# Patient Record
Sex: Female | Born: 1995 | Race: White | Hispanic: No | Marital: Single | Smoking: Never smoker
Health system: Southern US, Community
[De-identification: ages and names within clinical notes are randomized; demographics above are authoritative.]

## PROBLEM LIST (undated history)

## (undated) ENCOUNTER — Inpatient Hospital Stay (HOSPITAL_COMMUNITY): Payer: Self-pay

## (undated) DIAGNOSIS — O4290 Premature rupture of membranes, unspecified as to length of time between rupture and onset of labor, unspecified weeks of gestation: Secondary | ICD-10-CM

## (undated) DIAGNOSIS — K8020 Calculus of gallbladder without cholecystitis without obstruction: Secondary | ICD-10-CM

## (undated) DIAGNOSIS — F419 Anxiety disorder, unspecified: Secondary | ICD-10-CM

## (undated) HISTORY — PX: NO PAST SURGERIES: SHX2092

## (undated) HISTORY — PX: WISDOM TOOTH EXTRACTION: SHX21

## (undated) HISTORY — DX: Anxiety disorder, unspecified: F41.9

---
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
## 2013-02-17 ENCOUNTER — Encounter (HOSPITAL_COMMUNITY): Payer: Self-pay | Admitting: *Deleted

## 2013-02-17 ENCOUNTER — Emergency Department (HOSPITAL_COMMUNITY)
Admission: EM | Admit: 2013-02-17 | Discharge: 2013-02-17 | Disposition: A | Discharge status: Home / Self Care | Payer: Managed Care, Other (non HMO) | Attending: Emergency Medicine | Admitting: Emergency Medicine

## 2013-02-17 DIAGNOSIS — R809 Proteinuria, unspecified: Secondary | ICD-10-CM | POA: Insufficient documentation

## 2013-02-17 DIAGNOSIS — Z3202 Encounter for pregnancy test, result negative: Secondary | ICD-10-CM | POA: Insufficient documentation

## 2013-02-17 DIAGNOSIS — E860 Dehydration: Secondary | ICD-10-CM | POA: Insufficient documentation

## 2013-02-17 DIAGNOSIS — R109 Unspecified abdominal pain: Secondary | ICD-10-CM | POA: Insufficient documentation

## 2013-02-17 DIAGNOSIS — J45909 Unspecified asthma, uncomplicated: Secondary | ICD-10-CM | POA: Insufficient documentation

## 2013-02-17 DIAGNOSIS — R197 Diarrhea, unspecified: Secondary | ICD-10-CM | POA: Insufficient documentation

## 2013-02-17 DIAGNOSIS — Z91040 Latex allergy status: Secondary | ICD-10-CM | POA: Insufficient documentation

## 2013-02-17 DIAGNOSIS — R112 Nausea with vomiting, unspecified: Secondary | ICD-10-CM | POA: Insufficient documentation

## 2013-02-17 LAB — CBC WITH DIFFERENTIAL/PLATELET
Basophils Relative: 0 % (ref 0–1)
Eosinophils Absolute: 0 10*3/uL (ref 0.0–0.7)
Eosinophils Relative: 0 % (ref 0–5)
MCH: 29.3 pg (ref 26.0–34.0)
MCHC: 34.8 g/dL (ref 30.0–36.0)
MCV: 84.2 fL (ref 78.0–100.0)
Monocytes Relative: 5 % (ref 3–12)
Neutrophils Relative %: 71 % (ref 43–77)
Platelets: 272 10*3/uL (ref 150–400)

## 2013-02-17 LAB — URINALYSIS, ROUTINE W REFLEX MICROSCOPIC
Glucose, UA: NEGATIVE mg/dL
Ketones, ur: NEGATIVE mg/dL
Leukocytes, UA: NEGATIVE
Specific Gravity, Urine: >1.03 — ABNORMAL HIGH (ref 1.005–1.030)
pH: 6 (ref 5.0–8.0)

## 2013-02-17 LAB — URINE MICROSCOPIC-ADD ON

## 2013-02-17 LAB — COMPREHENSIVE METABOLIC PANEL
ALT: 9 U/L (ref 0–35)
AST: 15 U/L (ref 0–37)
Alkaline Phosphatase: 45 U/L (ref 39–117)
CO2: 24 mEq/L (ref 19–32)
Chloride: 102 mEq/L (ref 96–112)
GFR calc non Af Amer: >90 mL/min (ref >90)
Potassium: 3.4 mEq/L — ABNORMAL LOW (ref 3.5–5.1)
Sodium: 139 mEq/L (ref 135–145)
Total Bilirubin: 0.4 mg/dL (ref 0.3–1.2)

## 2013-02-17 LAB — LIPASE, BLOOD: Lipase: 37 U/L (ref 11–59)

## 2013-02-17 LAB — PREGNANCY, URINE: Preg Test, Ur: NEGATIVE

## 2013-02-17 MED ORDER — ONDANSETRON 4 MG PO TBDP: 4.0000 mg | ORAL_TABLET | Freq: Three times a day (TID) | ORAL | Status: DC | PRN

## 2013-02-17 MED ORDER — SODIUM CHLORIDE 0.9 % IV BOLUS (SEPSIS)
1000.0000 mL | INTRAVENOUS | Status: AC
  Administered 2013-02-17: 1000 mL via INTRAVENOUS

## 2013-02-17 MED ORDER — ONDANSETRON HCL 4 MG/2ML IJ SOLN
4.0000 mg | Freq: Once | INTRAMUSCULAR | Status: AC
  Administered 2013-02-17: 4 mg via INTRAVENOUS
  Filled 2013-02-17: qty 2

## 2013-02-17 MED ORDER — PROMETHAZINE HCL 25 MG PO TABS: 25.0000 mg | ORAL_TABLET | Freq: Four times a day (QID) | ORAL | Status: DC | PRN

## 2013-02-17 NOTE — ED Provider Notes (Signed)
History    CSN: [id number] Arrival date & time 02/17/13  1844  First MD Initiated Contact with Patient 02/17/13 1850     Chief Complaint  Patient presents with  . Emesis   (Consider location/radiation/quality/duration/timing/severity/associated sxs/prior Treatment) HPI Comments: 17 y/o female with hx of asthma who presents with c/o n/v and loose stools - she states that sx started last night, have been persistent, > 14 episodes of vomiting - no improvement, nothing makes worse, no associated diarrhea but notes loose non bloody stools.  She has some subacute abd pain for couple of months in lower abdomen which is unchanged today.  She denies ETOH use, deneis pregnancy and has had no fevers, cough, sob, blurred vision, sinus pressure or sore throat.  She does have some headache which was present last night but not today.  The history is provided by the patient and a friend.   Past Medical History  Diagnosis Date  . Asthma    History reviewed. No pertinent past surgical history. History reviewed. No pertinent family history. History  Substance Use Topics  . Smoking status: Never Smoker   . Smokeless tobacco: Not on file  . Alcohol Use: No   OB History   Grav Para Term Preterm Abortions TAB SAB Ect Mult Living                 Review of Systems  All other systems reviewed and are negative.    Allergies  Latex  Home Medications   Current Outpatient Rx  Name  Route  Sig  Dispense  Refill  . ibuprofen (MIDOL CRAMP FORMULA MAX ST) 200 MG tablet   Oral   Take 200 mg by mouth as needed for pain.          . norgestimate-ethinyl estradiol (SPRINTEC 28) 0.25-35 MG-MCG tablet   Oral   Take 1 tablet by mouth daily.         . ondansetron (ZOFRAN ODT) 4 MG disintegrating tablet   Oral   Take 1 tablet (4 mg total) by mouth every 8 (eight) hours as needed for nausea.   10 tablet   0   . promethazine (PHENERGAN) 25 MG tablet   Oral   Take 1 tablet (25 mg total) by mouth  every 6 (six) hours as needed for nausea.   12 tablet   0    BP 135/77  Pulse 106  Temp(Src) 99.3 F (37.4 C) (Oral)  Resp 16  Ht 5\' 2"  (1.575 m)  Wt 120 lb (54.432 kg)  BMI 21.94 kg/m2  SpO2 100%  LMP 02/13/2013 Physical Exam  Nursing note and vitals reviewed. Constitutional: She appears well-developed and well-nourished. No distress.  HENT:  Head: Normocephalic and atraumatic.  Mouth/Throat: Oropharynx is clear and moist. No oropharyngeal exudate.  Eyes: Conjunctivae and EOM are normal. Pupils are equal, round, and reactive to light. Right eye exhibits no discharge. Left eye exhibits no discharge. No scleral icterus.  Neck: Normal range of motion. Neck supple. No JVD present. No thyromegaly present.  Cardiovascular: Regular rhythm, normal heart sounds and intact distal pulses.  Exam reveals no gallop and no friction rub.   No murmur heard. Mild tachycardia  Pulmonary/Chest: Effort normal and breath sounds normal. No respiratory distress. She has no wheezes. She has no rales.  Abdominal: Soft. Bowel sounds are normal. She exhibits no distension and no mass. There is tenderness ( minimal diffuse ttp, SP > other locations, no guarding, no masses, no peritoneal signs, no  muprhy sign).  Musculoskeletal: Normal range of motion. She exhibits no edema and no tenderness.  Lymphadenopathy:    She has no cervical adenopathy.  Neurological: She is alert. Coordination normal.  Skin: Skin is warm and dry. No rash noted. No erythema.  Psychiatric: She has a normal mood and affect. Her behavior is normal.    ED Course  Procedures (including critical care time) Labs Reviewed  URINALYSIS, ROUTINE W REFLEX MICROSCOPIC - Abnormal; Notable for the following:    Specific Gravity, Urine >1.030 (*)    Protein, ur >300 (*)    All other components within normal limits  COMPREHENSIVE METABOLIC PANEL - Abnormal; Notable for the following:    Potassium 3.4 (*)    Glucose, Bld 119 (*)    All other  components within normal limits  URINE MICROSCOPIC-ADD ON - Abnormal; Notable for the following:    Squamous Epithelial / LPF FEW (*)    Bacteria, UA FEW (*)    All other components within normal limits  URINE CULTURE  PREGNANCY, URINE  CBC WITH DIFFERENTIAL  LIPASE, BLOOD   No results found. 1. Nausea and vomiting   2. Proteinuria   3. Dehydration     MDM  The pt appears non toxic but after not eating anything but a couple of crackers today with minimal fluid intake, I suspect that the pt is dehdyrated explaining the tachycardia.  Afebrile, no hypotension and no pulmonary findings.  R/o pregnancy, UTI, lyts, possible infectious but no diarrhea.  Labs, fulids, antiemetics.  Labs show proteinuria - no other acute findings - she has improved with IVF and zofran - stable for d/c.  Meds given in ED:  Medications  sodium chloride 0.9 % bolus 1,000 mL (1,000 mLs Intravenous New Bag/Given 02/17/13 1934)  ondansetron (ZOFRAN) injection 4 mg (4 mg Intravenous Given 02/17/13 1934)    New Prescriptions   ONDANSETRON (ZOFRAN ODT) 4 MG DISINTEGRATING TABLET    Take 1 tablet (4 mg total) by mouth every 8 (eight) hours as needed for nausea.   PROMETHAZINE (PHENERGAN) 25 MG TABLET    Take 1 tablet (25 mg total) by mouth every 6 (six) hours as needed for nausea.      Vida Roller, MD 02/17/13 [phone]

## 2013-02-17 NOTE — ED Notes (Signed)
NVD since last night, abd pain,

## 2013-02-19 LAB — URINE CULTURE

## 2013-08-11 ENCOUNTER — Emergency Department (HOSPITAL_COMMUNITY): Payer: Managed Care, Other (non HMO)

## 2013-08-11 ENCOUNTER — Emergency Department (HOSPITAL_COMMUNITY)
Admission: EM | Admit: 2013-08-11 | Discharge: 2013-08-11 | Disposition: A | Discharge status: Home / Self Care | Payer: Managed Care, Other (non HMO) | Attending: Emergency Medicine | Admitting: Emergency Medicine

## 2013-08-11 ENCOUNTER — Encounter (HOSPITAL_COMMUNITY): Payer: Self-pay | Admitting: Emergency Medicine

## 2013-08-11 DIAGNOSIS — Z79899 Other long term (current) drug therapy: Secondary | ICD-10-CM | POA: Insufficient documentation

## 2013-08-11 DIAGNOSIS — K8020 Calculus of gallbladder without cholecystitis without obstruction: Secondary | ICD-10-CM | POA: Insufficient documentation

## 2013-08-11 DIAGNOSIS — Z91040 Latex allergy status: Secondary | ICD-10-CM | POA: Insufficient documentation

## 2013-08-11 DIAGNOSIS — J45909 Unspecified asthma, uncomplicated: Secondary | ICD-10-CM | POA: Insufficient documentation

## 2013-08-11 LAB — COMPREHENSIVE METABOLIC PANEL
ALT: 10 U/L (ref 0–35)
AST: 16 U/L (ref 0–37)
Albumin: 4.6 g/dL (ref 3.5–5.2)
Alkaline Phosphatase: 60 U/L (ref 39–117)
BUN: 9 mg/dL (ref 6–23)
Chloride: 107 mEq/L (ref 96–112)
Potassium: 3.6 mEq/L (ref 3.5–5.1)
Sodium: 145 mEq/L (ref 135–145)
Total Bilirubin: 0.3 mg/dL (ref 0.3–1.2)
Total Protein: 8.4 g/dL — ABNORMAL HIGH (ref 6.0–8.3)

## 2013-08-11 LAB — LIPASE, BLOOD: Lipase: 38 U/L (ref 11–59)

## 2013-08-11 LAB — CBC WITH DIFFERENTIAL/PLATELET
Basophils Absolute: 0 10*3/uL (ref 0.0–0.1)
Basophils Relative: 0 % (ref 0–1)
Eosinophils Absolute: 0.1 10*3/uL (ref 0.0–0.7)
Hemoglobin: 14 g/dL (ref 12.0–15.0)
MCH: 29.2 pg (ref 26.0–34.0)
MCHC: 34.9 g/dL (ref 30.0–36.0)
Monocytes Relative: 5 % (ref 3–12)
Neutro Abs: 5.8 10*3/uL (ref 1.7–7.7)
Neutrophils Relative %: 63 % (ref 43–77)
Platelets: 315 10*3/uL (ref 150–400)

## 2013-08-11 MED ORDER — OXYCODONE-ACETAMINOPHEN 5-325 MG PO TABS: 1.0000 | ORAL_TABLET | ORAL | Status: DC | PRN

## 2013-08-11 MED ORDER — PROMETHAZINE HCL 25 MG PO TABS: 25.0000 mg | ORAL_TABLET | Freq: Four times a day (QID) | ORAL | Status: DC | PRN

## 2013-08-11 NOTE — ED Provider Notes (Signed)
CSN: [id number]     Arrival date & time 08/11/13  1057 History  This chart was scribed for Donnetta Hutching, MD by Bennett Scrape, ED Scribe. This patient was seen in room APA08/[facility]08 and the patient's care was started at 2:11 PM.  Chief Complaint  Patient presents with  . Abdominal Pain   The history is provided by the patient. No language interpreter was used.    HPI Comments: Renee Matthews is a 17 y.o. female who presents to the Emergency Department complaining of intermittent bilateral upper abdomen that radiates to bilateral flanks that has been occuring since the birth of her son in 2011. She states that the episodes usually last 2 hours but today, this episode last 5 hours. She describes the pain as "achy". She denies having any triggers and she denies having any prior work ups. She denies any nausea, emesis or diarrhea.    Past Medical History  Diagnosis Date  . Asthma    History reviewed. No pertinent past surgical history. History reviewed. No pertinent family history. History  Substance Use Topics  . Smoking status: Never Smoker   . Smokeless tobacco: Not on file  . Alcohol Use: No    Review of Systems  A complete 10 system review of systems was obtained and all systems are negative except as noted in the HPI and PMH.   Allergies  Latex  Home Medications   Current Outpatient Rx  Name  Route  Sig  Dispense  Refill  . ibuprofen (MIDOL CRAMP FORMULA MAX ST) 200 MG tablet   Oral   Take 200 mg by mouth as needed for pain.          . norgestimate-ethinyl estradiol (SPRINTEC 28) 0.25-35 MG-MCG tablet   Oral   Take 1 tablet by mouth daily.         . ondansetron (ZOFRAN ODT) 4 MG disintegrating tablet   Oral   Take 1 tablet (4 mg total) by mouth every 8 (eight) hours as needed for nausea.   10 tablet   0   . promethazine (PHENERGAN) 25 MG tablet   Oral   Take 1 tablet (25 mg total) by mouth every 6 (six) hours as needed for nausea.   12 tablet   0     Triage Vitals: BP 119/68  Pulse 100  Temp(Src) 98.2 F (36.8 C) (Oral)  Resp 18  Ht 5\' 2"  (1.575 m)  Wt 120 lb (54.432 kg)  BMI 21.94 kg/m2  SpO2 100%  LMP 08/07/2013  Physical Exam  Nursing note and vitals reviewed. Constitutional: She is oriented to person, place, and time. She appears well-developed and well-nourished.  HENT:  Head: Normocephalic and atraumatic.  Eyes: Conjunctivae and EOM are normal. Pupils are equal, round, and reactive to light.  Neck: Normal range of motion. Neck supple.  Cardiovascular: Normal rate, regular rhythm and normal heart sounds.   Pulmonary/Chest: Effort normal and breath sounds normal.  Abdominal: Soft. Bowel sounds are normal.  Musculoskeletal: Normal range of motion.  Neurological: She is alert and oriented to person, place, and time.  Skin: Skin is warm and dry.  Psychiatric: She has a normal mood and affect. Her behavior is normal.    ED Course  Procedures (including critical care time)  DIAGNOSTIC STUDIES: Oxygen Saturation is 100% on room air, normal by my interpretation.    COORDINATION OF CARE: 2:30 PM-Discussed treatment plan which includes [place] to check out the gallbladder, CBC panel, CMP, and lipase with pt  at bedside and pt agreed to plan.   Labs Review Labs Reviewed  COMPREHENSIVE METABOLIC PANEL - Abnormal; Notable for the following:    Glucose, Bld 107 (*)    Total Protein 8.4 (*)    All other components within normal limits  CBC WITH DIFFERENTIAL  LIPASE, BLOOD  AMYLASE   Imaging Review US Abdomen Limited Ruq  08/11/2013   CLINICAL DATA:  Right upper quadrant pain.  EXAM: US ABDOMEN LIMITED - RIGHT UPPER QUADRANT  COMPARISON:  None.  FINDINGS: Gallbladder  Gallbladder is moderately distended. There multiple shadowing stones. There is no wall thickening or evidence of acute cholecystitis.  Common bile duct  Diameter: 4.6 mm.  No duct stone is seen.  Liver:  No focal lesion identified. Within normal limits in  parenchymal echogenicity.  IMPRESSION: 1. Cholelithiasis. No sonographic evidence of acute cholecystitis. No other abnormalities.   Electronically Signed   By: Amie Portland M.D.   On: 08/11/2013 15:16    EKG Interpretation   None       MDM  No diagnosis found. No acute abdomen. Ultrasound reveals cholelithiasis but no cholecystitis. Referral to general surgery. Discharge medications Percocet and Phenergan 25 mg  I personally performed the services described in this documentation, which was scribed in my presence. The recorded information has been reviewed and is accurate.    Donnetta Hutching, MD 08/11/13 [phone]

## 2013-08-11 NOTE — ED Notes (Signed)
Upper abd pain, nausea, no vomiting.  Tender upper abd  And across lower ant ribs.   Says she was seen here for same in past.

## 2013-08-11 NOTE — ED Notes (Signed)
Patient transported to Ultrasound 

## 2014-08-30 ENCOUNTER — Encounter (HOSPITAL_COMMUNITY): Payer: Self-pay | Admitting: *Deleted

## 2014-08-30 ENCOUNTER — Emergency Department (HOSPITAL_COMMUNITY)
Admission: EM | Admit: 2014-08-30 | Discharge: 2014-08-30 | Disposition: A | Discharge status: Home / Self Care | Payer: Medicaid Other | Attending: Emergency Medicine | Admitting: Emergency Medicine

## 2014-08-30 DIAGNOSIS — J45909 Unspecified asthma, uncomplicated: Secondary | ICD-10-CM | POA: Diagnosis not present

## 2014-08-30 DIAGNOSIS — Z91040 Latex allergy status: Secondary | ICD-10-CM | POA: Diagnosis not present

## 2014-08-30 DIAGNOSIS — K8050 Calculus of bile duct without cholangitis or cholecystitis without obstruction: Secondary | ICD-10-CM | POA: Diagnosis not present

## 2014-08-30 DIAGNOSIS — R1011 Right upper quadrant pain: Secondary | ICD-10-CM | POA: Diagnosis present

## 2014-08-30 LAB — CBC WITH DIFFERENTIAL/PLATELET
Basophils Absolute: 0.1 10*3/uL (ref 0.0–0.1)
Basophils Relative: 1 % (ref 0–1)
EOS ABS: 0.1 10*3/uL (ref 0.0–0.7)
Eosinophils Relative: 1 % (ref 0–5)
HEMATOCRIT: 40.9 % (ref 36.0–46.0)
Hemoglobin: 13.7 g/dL (ref 12.0–15.0)
LYMPHS ABS: 3.4 10*3/uL (ref 0.7–4.0)
LYMPHS PCT: 34 % (ref 12–46)
MCH: 28.6 pg (ref 26.0–34.0)
MCHC: 33.5 g/dL (ref 30.0–36.0)
MCV: 85.4 fL (ref 78.0–100.0)
MONOS PCT: 5 % (ref 3–12)
Monocytes Absolute: 0.5 10*3/uL (ref 0.1–1.0)
Neutro Abs: 6.1 10*3/uL (ref 1.7–7.7)
Neutrophils Relative %: 59 % (ref 43–77)
Platelets: 349 10*3/uL (ref 150–400)
RBC: 4.79 MIL/uL (ref 3.87–5.11)
RDW: 13.2 % (ref 11.5–15.5)
WBC: 10.2 10*3/uL (ref 4.0–10.5)

## 2014-08-30 LAB — COMPREHENSIVE METABOLIC PANEL
ALBUMIN: 4.6 g/dL (ref 3.5–5.2)
ALK PHOS: 47 U/L (ref 39–117)
ALT: 19 U/L (ref 0–35)
AST: 22 U/L (ref 0–37)
Anion gap: 6 (ref 5–15)
BILIRUBIN TOTAL: 0.3 mg/dL (ref 0.3–1.2)
BUN: 8 mg/dL (ref 6–23)
CHLORIDE: 110 meq/L (ref 96–112)
CO2: 26 mmol/L (ref 19–32)
Calcium: 9.8 mg/dL (ref 8.4–10.5)
Creatinine, Ser: 0.82 mg/dL (ref 0.50–1.10)
GFR calc Af Amer: >90 mL/min (ref >90)
GFR calc non Af Amer: >90 mL/min (ref >90)
Glucose, Bld: 93 mg/dL (ref 70–99)
POTASSIUM: 4.3 mmol/L (ref 3.5–5.1)
SODIUM: 142 mmol/L (ref 135–145)
TOTAL PROTEIN: 7.7 g/dL (ref 6.0–8.3)

## 2014-08-30 LAB — LIPASE, BLOOD: Lipase: 34 U/L (ref 11–59)

## 2014-08-30 MED ORDER — HYDROCODONE-ACETAMINOPHEN 5-325 MG PO TABS
1.0000 | ORAL_TABLET | Freq: Once | ORAL | Status: AC
  Administered 2014-08-30: 1 via ORAL
  Filled 2014-08-30: qty 1

## 2014-08-30 NOTE — Discharge Instructions (Signed)
Biliary Colic  °Biliary colic is a steady or irregular pain in the upper abdomen. It is usually under the right side of the rib cage. It happens when gallstones interfere with the normal flow of bile from the gallbladder. Bile is a liquid that helps to digest fats. Bile is made in the liver and stored in the gallbladder. When you eat a meal, bile passes from the gallbladder through the cystic duct and the common bile duct into the small intestine. There, it mixes with partially digested food. If a gallstone blocks either of these ducts, the normal flow of bile is blocked. The muscle cells in the bile duct contract forcefully to try to move the stone. This causes the pain of biliary colic.  °SYMPTOMS  °· A person with biliary colic usually complains of pain in the upper abdomen. This pain can be: °¨ In the center of the upper abdomen just below the breastbone. °¨ In the upper-right part of the abdomen, near the gallbladder and liver. °¨ Spread back toward the right shoulder blade. °· Nausea and vomiting. °· The pain usually occurs after eating. °· Biliary colic is usually triggered by the digestive system's demand for bile. The demand for bile is high after fatty meals. Symptoms can also occur when a person who has been fasting suddenly eats a very large meal. Most episodes of biliary colic pass after 1 to 5 hours. After the most intense pain passes, your abdomen may continue to ache mildly for about 24 hours. °DIAGNOSIS  °After you describe your symptoms, your caregiver will perform a physical exam. He or she will pay attention to the upper right portion of your belly (abdomen). This is the area of your liver and gallbladder. An ultrasound will help your caregiver look for gallstones. Specialized scans of the gallbladder may also be done. Blood tests may be done, especially if you have fever or if your pain persists. °PREVENTION  °Biliary colic can be prevented by controlling the risk factors for gallstones. Some of  these risk factors, such as heredity, increasing age, and pregnancy are a normal part of life. Obesity and a high-fat diet are risk factors you can change through a healthy lifestyle. Women going through menopause who take hormone replacement therapy (estrogen) are also more likely to develop biliary colic. °TREATMENT  °· Pain medication may be prescribed. °· You may be encouraged to eat a fat-free diet. °· If the first episode of biliary colic is severe, or episodes of colic keep retuning, surgery to remove the gallbladder (cholecystectomy) is usually recommended. This procedure can be done through small incisions using an instrument called a laparoscope. The procedure often requires a brief stay in the hospital. Some people can leave the hospital the same day. It is the most widely used treatment in people troubled by painful gallstones. It is effective and safe, with no complications in more than 90% of cases. °· If surgery cannot be done, medication that dissolves gallstones may be used. This medication is expensive and can take months or years to work. Only small stones will dissolve. °· Rarely, medication to dissolve gallstones is combined with a procedure called shock-wave lithotripsy. This procedure uses carefully aimed shock waves to break up gallstones. In many people treated with this procedure, gallstones form again within a few years. °PROGNOSIS  °If gallstones block your cystic duct or common bile duct, you are at risk for repeated episodes of biliary colic. There is also a 25% chance that you will develop   a gallbladder infection(acute cholecystitis), or some other complication of gallstones within 10 to 20 years. If you have surgery, schedule it at a time that is convenient for you and at a time when you are not sick. HOME CARE INSTRUCTIONS   Drink plenty of clear fluids.  Avoid fatty, greasy or fried foods, or any foods that make your pain worse.  Take medications as directed. SEEK MEDICAL  CARE IF:   You develop a fever over 100.5 F (38.1 C).  Your pain gets worse over time.  You develop nausea that prevents you from eating and drinking.  You develop vomiting. SEEK IMMEDIATE MEDICAL CARE IF:   You have continuous or severe belly (abdominal) pain which is not relieved with medications.  You develop nausea and vomiting which is not relieved with medications.  You have symptoms of biliary colic and you suddenly develop a fever and shaking chills. This may signal cholecystitis. Call your caregiver immediately.  You develop a yellow color to your skin or the white part of your eyes (jaundice). Document Released: 01/11/2006 Document Revised: 11/02/2011 Document Reviewed: 03/22/2008 [facility] Patient Information 2015 [employer] [place]. This information is not intended to replace advice given to you by your health care provider. Make sure you discuss any questions you have with your health care provider.  Make appointment to follow-up with general surgery. Gallbladder needs to be removed. Return for any new or worse symptoms. Return if the abdominal pain comes back and does not resolve over several hours. Or for fever or for persistent vomiting.

## 2014-08-30 NOTE — ED Notes (Signed)
Pt with abd pain, hx of same and was dx with gallstones, + diarrhea, denies N/V

## 2014-08-30 NOTE — ED Provider Notes (Addendum)
CSN: [id number]     Arrival date & time 08/30/14  1904 History   First MD Initiated Contact with Patient 08/30/14 1936     Chief Complaint  Patient presents with  . Abdominal Pain     (Consider location/radiation/quality/duration/timing/severity/associated sxs/prior Treatment) Patient is a 19 y.o. female presenting with abdominal pain. The history is provided by the patient.  Abdominal Pain Associated symptoms: no chest pain, no diarrhea, no dysuria, no fever, no nausea, no shortness of breath and no vomiting    patient seen in December with right upper quadrant abdominal pain ultrasound showed gallstones normal common bile duct. Therefore symptoms consistent with symptomatic gallstones. Patient [place] But did not follow-up. Patient had recurrent abdominal pain early this morning which resolved. And then it's 5 PM this evening it reoccurred. Still present. But improving. Currently 5 out of 10. No nausea no vomiting no diarrhea. Patient appears comfortable in no significant distress.  Past Medical History  Diagnosis Date  . Asthma    History reviewed. No pertinent past surgical history. History reviewed. No pertinent family history. History  Substance Use Topics  . Smoking status: Never Smoker   . Smokeless tobacco: Not on file  . Alcohol Use: No   OB History    No data available     Review of Systems  Constitutional: Negative for fever.  HENT: Negative for congestion.   Eyes: Negative for redness.  Respiratory: Negative for shortness of breath.   Cardiovascular: Negative for chest pain.  Gastrointestinal: Positive for abdominal pain. Negative for nausea, vomiting and diarrhea.  Genitourinary: Negative for dysuria.  Musculoskeletal: Negative for back pain.  Skin: Negative for rash.  Neurological: Negative for headaches.  Hematological: Does not bruise/bleed easily.  Psychiatric/Behavioral: Negative for confusion.       Allergies  Latex  Home Medications   Prior to Admission medications   Medication Sig Start Date End Date Taking? Authorizing Provider  ibuprofen (MIDOL CRAMP FORMULA MAX ST) 200 MG tablet Take 200 mg by mouth daily as needed for cramping (ca).     Historical Provider, MD  oxyCODONE-acetaminophen (PERCOCET) 5-325 MG per tablet Take 1 tablet by mouth every 4 (four) hours as needed. Patient not taking: Reported on 08/30/2014 08/11/13   Donnetta HutchingBrian Cook, MD  promethazine (PHENERGAN) 25 MG tablet Take 1 tablet (25 mg total) by mouth every 6 (six) hours as needed for nausea or vomiting. Patient not taking: Reported on 08/30/2014 08/11/13   Donnetta HutchingBrian Cook, MD   BP 122/84 mmHg  Pulse 94  Temp(Src) 98.1 F (36.7 C) (Oral)  Resp 20  Ht 5\' 3"  (1.6 m)  SpO2 100%  LMP 08/13/2014 Physical Exam  Constitutional: She is oriented to person, place, and time. She appears well-developed and well-nourished. No distress.  HENT:  Head: Normocephalic and atraumatic.  Mouth/Throat: Oropharynx is clear and moist.  Eyes: Conjunctivae and EOM are normal. Pupils are equal, round, and reactive to light.  Neck: Normal range of motion.  Cardiovascular: Normal rate, regular rhythm and normal heart sounds.   Pulmonary/Chest: Effort normal and breath sounds normal. No respiratory distress.  Abdominal: Soft. Bowel sounds are normal. There is no tenderness.  Musculoskeletal: Normal range of motion. She exhibits no edema.  Neurological: She is alert and oriented to person, place, and time. No cranial nerve deficit. She exhibits normal muscle tone. Coordination normal.  Skin: Skin is warm. No rash noted.  Nursing note and vitals reviewed.   ED Course  Procedures (  including critical care time) Labs Review Labs Reviewed  CBC WITH DIFFERENTIAL  COMPREHENSIVE METABOLIC PANEL  LIPASE, BLOOD   Results for orders placed or performed during the hospital encounter of 08/30/14  CBC with Differential  Result Value Ref Range    WBC 10.2 4.0 - 10.5 K/uL   RBC 4.79 3.87 - 5.11 MIL/uL   Hemoglobin 13.7 12.0 - 15.0 g/dL   HCT 16.1 09.6 - 04.5 %   MCV 85.4 78.0 - 100.0 fL   MCH 28.6 26.0 - 34.0 pg   MCHC 33.5 30.0 - 36.0 g/dL   RDW 40.9 81.1 - 91.4 %   Platelets 349 150 - 400 K/uL   Neutrophils Relative % 59 43 - 77 %   Neutro Abs 6.1 1.7 - 7.7 K/uL   Lymphocytes Relative 34 12 - 46 %   Lymphs Abs 3.4 0.7 - 4.0 K/uL   Monocytes Relative 5 3 - 12 %   Monocytes Absolute 0.5 0.1 - 1.0 K/uL   Eosinophils Relative 1 0 - 5 %   Eosinophils Absolute 0.1 0.0 - 0.7 K/uL   Basophils Relative 1 0 - 1 %   Basophils Absolute 0.1 0.0 - 0.1 K/uL   Results for orders placed or performed during the hospital encounter of 08/30/14  Comprehensive metabolic panel  Result Value Ref Range   Sodium 142 135 - 145 mmol/L   Potassium 4.3 3.5 - 5.1 mmol/L   Chloride 110 96 - 112 mEq/L   CO2 26 19 - 32 mmol/L   Glucose, Bld 93 70 - 99 mg/dL   BUN 8 6 - 23 mg/dL   Creatinine, Ser 7.82 0.50 - 1.10 mg/dL   Calcium 9.8 8.4 - 95.6 mg/dL   Total Protein 7.7 6.0 - 8.3 g/dL   Albumin 4.6 3.5 - 5.2 g/dL   AST 22 0 - 37 U/L   ALT 19 0 - 35 U/L   Alkaline Phosphatase 47 39 - 117 U/L   Total Bilirubin 0.3 0.3 - 1.2 mg/dL   GFR calc non Af Amer >90 >90 mL/min   GFR calc Af Amer >90 >90 mL/min   Anion gap 6 5 - 15  Lipase, blood  Result Value Ref Range   Lipase 34 11 - 59 U/L  CBC with Differential  Result Value Ref Range   WBC 10.2 4.0 - 10.5 K/uL   RBC 4.79 3.87 - 5.11 MIL/uL   Hemoglobin 13.7 12.0 - 15.0 g/dL   HCT 21.3 08.6 - 57.8 %   MCV 85.4 78.0 - 100.0 fL   MCH 28.6 26.0 - 34.0 pg   MCHC 33.5 30.0 - 36.0 g/dL   RDW 46.9 62.9 - 52.8 %   Platelets 349 150 - 400 K/uL   Neutrophils Relative % 59 43 - 77 %   Neutro Abs 6.1 1.7 - 7.7 K/uL   Lymphocytes Relative 34 12 - 46 %   Lymphs Abs 3.4 0.7 - 4.0 K/uL   Monocytes Relative 5 3 - 12 %   Monocytes Absolute 0.5 0.1 - 1.0 K/uL   Eosinophils Relative 1 0 - 5 %   Eosinophils  Absolute 0.1 0.0 - 0.7 K/uL   Basophils Relative 1 0 - 1 %   Basophils Absolute 0.1 0.0 - 0.1 K/uL     Imaging Review No results found.   EKG Interpretation None      MDM   Final diagnoses:  Biliary colic    Patient with diagnosis of gallstones are symptomatic in  December. Was referred to surgery but did not follow-up. Patient with recurrent abdominal pain this morning that resolved and then came back again at 5. Still present. Currently 5 out of 10 and it's improved. No nausea no vomiting no fevers.  Patient's abdomen is without significant tenderness in the right upper quadrant. Patient is nontoxic no acute distress. No significant leukocytosis. Liver function tests will be evaluated to see if is any change compared to December. Patient will be referred again to Dr. Lovell Sheehan. Patient understands the importance of following up.  Liver function tests without any sniffing abnormalities. Patient stable for discharge home. Vanetta Mulders, MD 08/30/14 2119  Vanetta Mulders, MD 08/30/14 2129

## 2014-08-30 NOTE — ED Notes (Signed)
Discharge instructions given, pt demonstrated teach back and verbal understanding. No concerns voiced.  

## 2014-12-07 IMAGING — US US ABDOMEN LIMITED
1 series · 14 of 25 positions shown · non-contrast
Comparison: None.

CLINICAL DATA: Right upper quadrant pain.

EXAM:
US ABDOMEN LIMITED - RIGHT UPPER QUADRANT

[Series 1: us abdomen limited · 0.18mm/px · 14 of 68 slices shown]
[im 1/68]
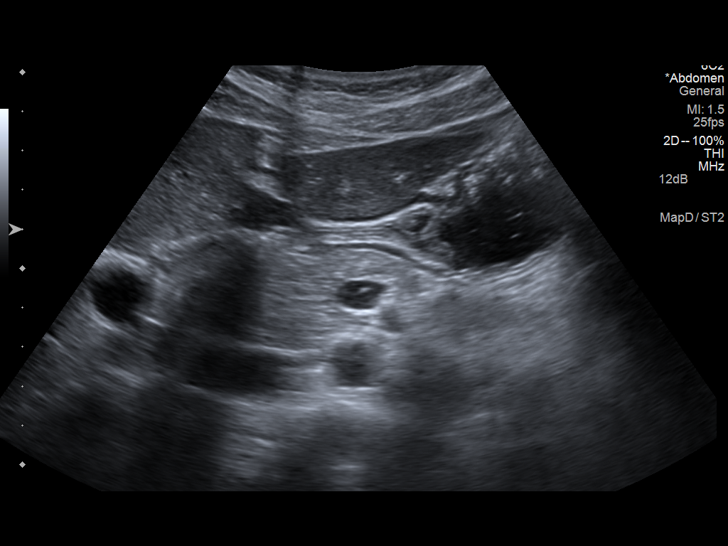
[im 6/68]
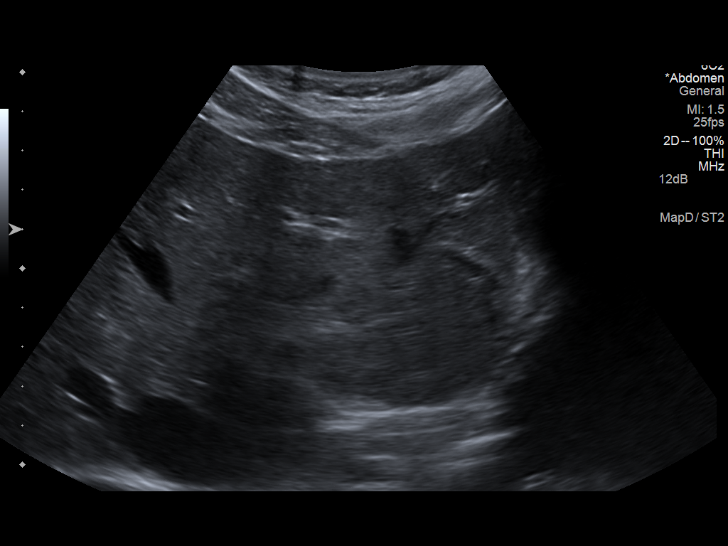
[im 12/68]
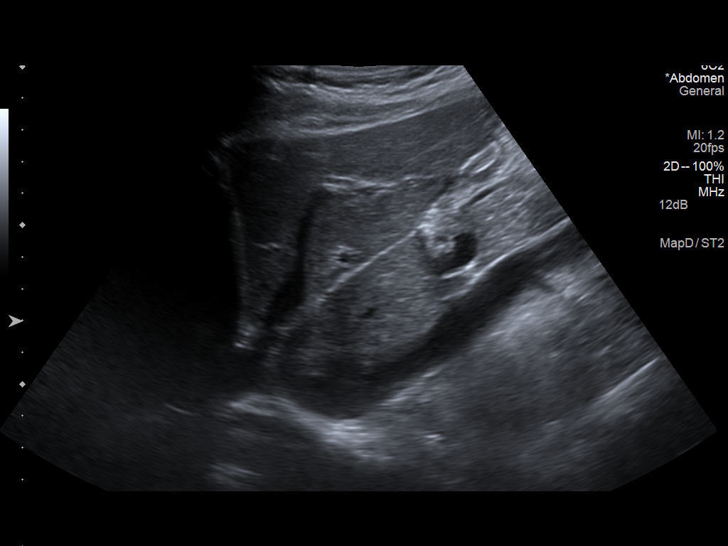
[im 17/68]
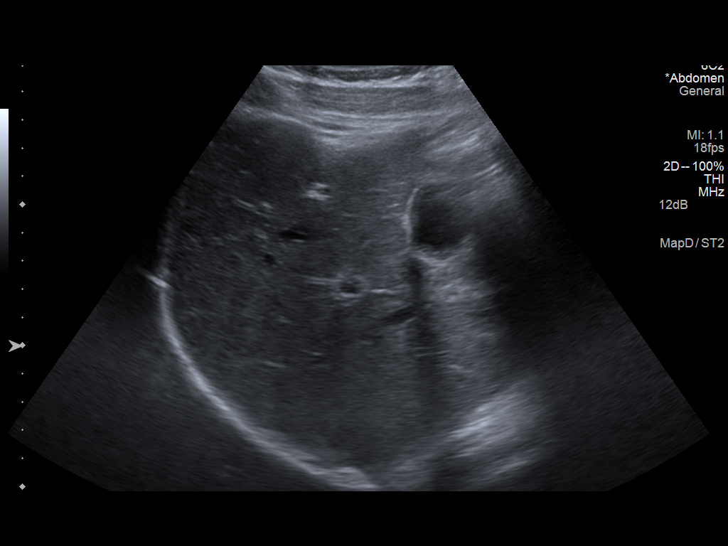
[im 23/68]
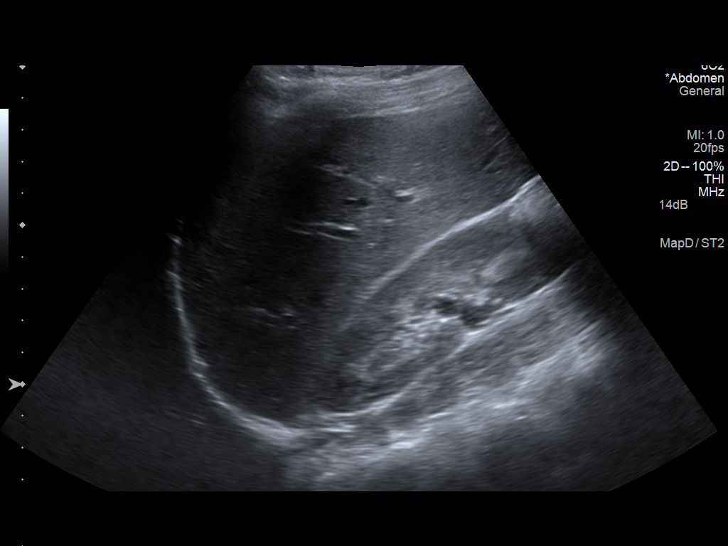
[im 26/68]
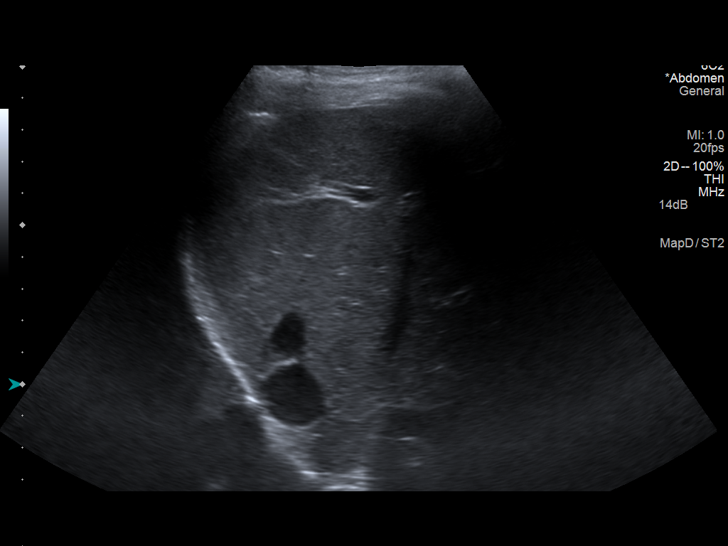
[im 31/68]
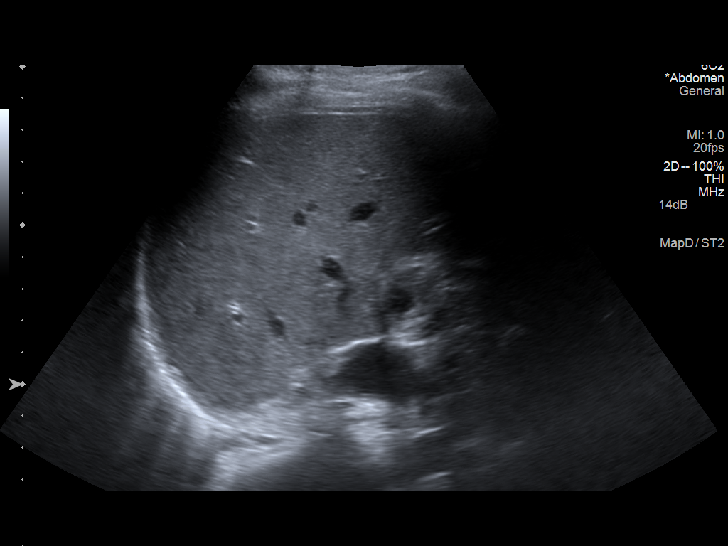
[im 37/68]
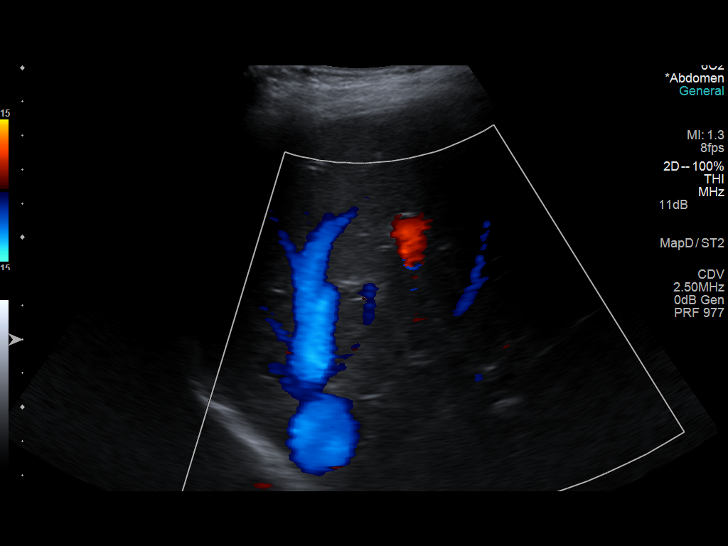
[im 42/68]
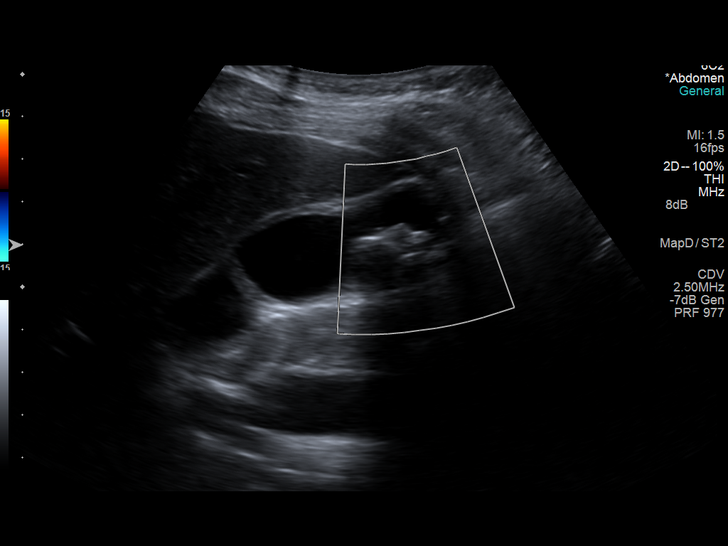
[im 45/68]
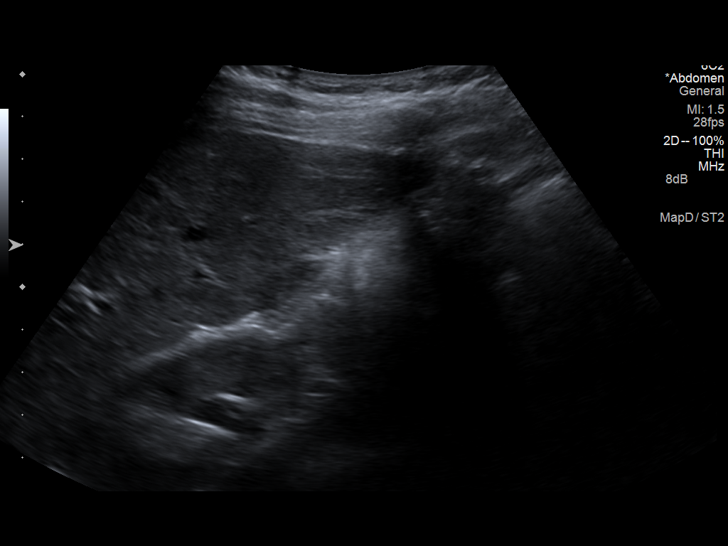
[im 51/68]
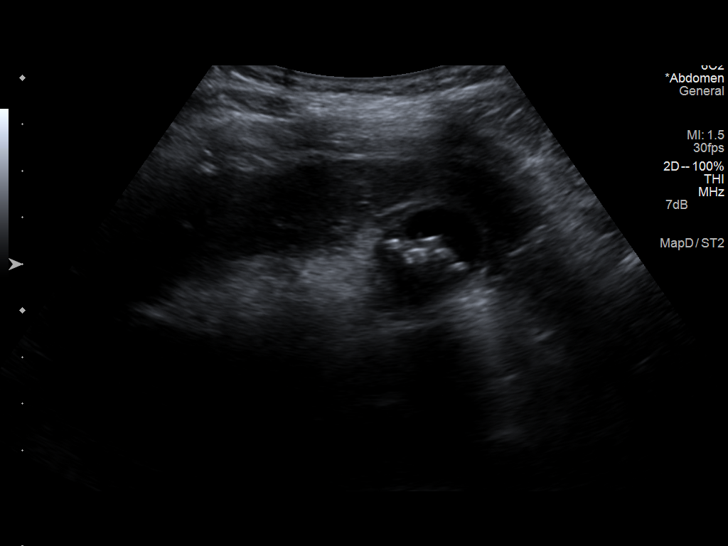
[im 56/68]
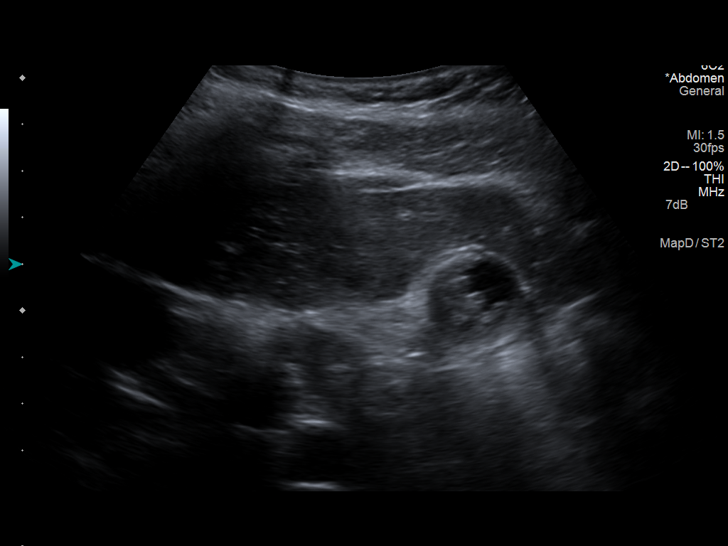
[im 62/68]
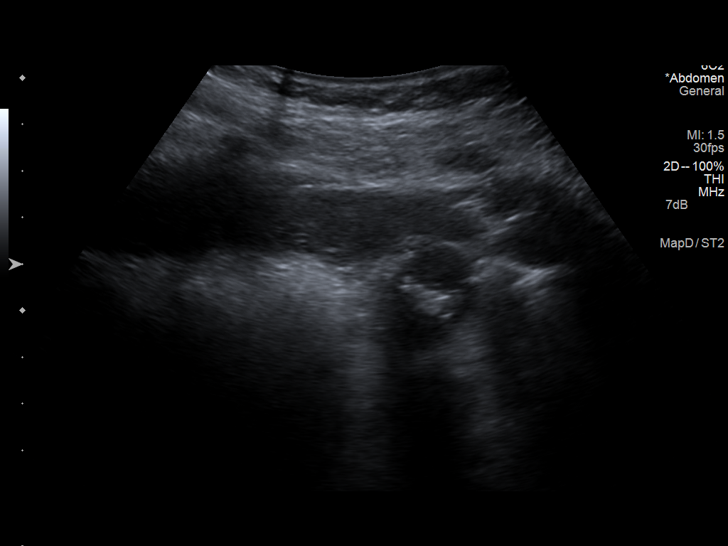
[im 68/68]
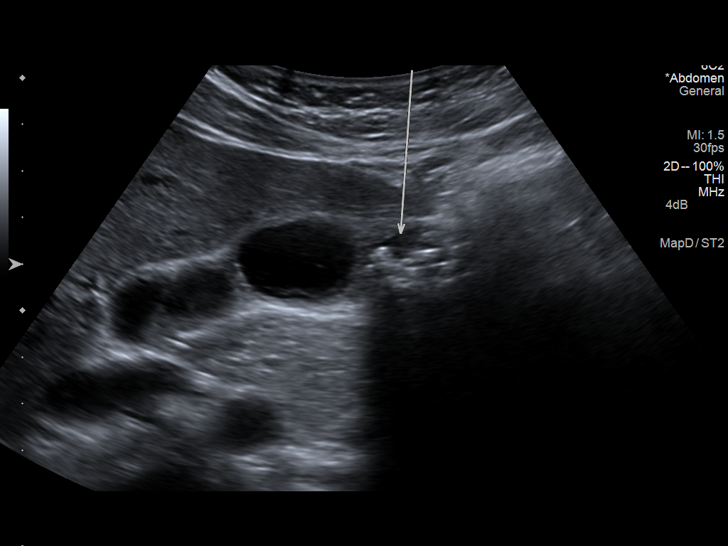

[14 of 25 positions shown; findings below may reference images not displayed]

FINDINGS: Gallbladder

Gallbladder is moderately distended. There multiple shadowing
stones. There is no wall thickening or evidence of acute
cholecystitis.

Common bile duct

Diameter: 4.6 mm.  No duct stone is seen.

Liver:

No focal lesion identified. Within normal limits in parenchymal
echogenicity.
IMPRESSION: 1. Cholelithiasis. No sonographic evidence of acute cholecystitis.
No other abnormalities.

## 2015-02-17 ENCOUNTER — Emergency Department (HOSPITAL_COMMUNITY)
Admission: EM | Admit: 2015-02-17 | Discharge: 2015-02-18 | Disposition: A | Discharge status: Home / Self Care | Payer: Medicaid Other | Attending: Emergency Medicine | Admitting: Emergency Medicine

## 2015-02-17 ENCOUNTER — Encounter (HOSPITAL_COMMUNITY): Payer: Self-pay | Admitting: *Deleted

## 2015-02-17 DIAGNOSIS — Z91040 Latex allergy status: Secondary | ICD-10-CM | POA: Insufficient documentation

## 2015-02-17 DIAGNOSIS — R1013 Epigastric pain: Secondary | ICD-10-CM | POA: Insufficient documentation

## 2015-02-17 DIAGNOSIS — R110 Nausea: Secondary | ICD-10-CM | POA: Insufficient documentation

## 2015-02-17 DIAGNOSIS — Z3202 Encounter for pregnancy test, result negative: Secondary | ICD-10-CM | POA: Diagnosis not present

## 2015-02-17 DIAGNOSIS — J45909 Unspecified asthma, uncomplicated: Secondary | ICD-10-CM | POA: Diagnosis not present

## 2015-02-17 DIAGNOSIS — Z793 Long term (current) use of hormonal contraceptives: Secondary | ICD-10-CM | POA: Insufficient documentation

## 2015-02-17 DIAGNOSIS — R1010 Upper abdominal pain, unspecified: Secondary | ICD-10-CM | POA: Insufficient documentation

## 2015-02-17 LAB — URINALYSIS, ROUTINE W REFLEX MICROSCOPIC
Bilirubin Urine: NEGATIVE
Glucose, UA: NEGATIVE mg/dL
Hgb urine dipstick: NEGATIVE
Ketones, ur: NEGATIVE mg/dL
Leukocytes, UA: NEGATIVE
Nitrite: NEGATIVE
Protein, ur: NEGATIVE mg/dL
Specific Gravity, Urine: 1.025 (ref 1.005–1.030)
UROBILINOGEN UA: 0.2 mg/dL (ref 0.0–1.0)
pH: 6 (ref 5.0–8.0)

## 2015-02-17 LAB — PREGNANCY, URINE: Preg Test, Ur: NEGATIVE

## 2015-02-17 MED ORDER — GI COCKTAIL ~~LOC~~
30.0000 mL | Freq: Once | ORAL | Status: AC
  Administered 2015-02-17: 30 mL via ORAL
  Filled 2015-02-17: qty 30

## 2015-02-17 NOTE — ED Notes (Signed)
Pt c/o epigastric pain that started this afternoon, pt reports that she usually will have pain like this when she eats something spicy, admits to nausea,

## 2015-02-17 NOTE — ED Provider Notes (Signed)
CSN: [id number]     Arrival date & time 02/17/15  2106 History   First MD Initiated Contact with Patient 02/17/15 2240     Chief Complaint  Patient presents with  . Abdominal Pain     (Consider location/radiation/quality/duration/timing/severity/associated sxs/prior Treatment) HPI   Renee Matthews is a 19 y.o. female who presents to the Emergency Department complaining of epigastric pain.  She states the pain has been waxing and waning for "months" but became more intense this afternoon after eating spicy marinara sauce and cheese.  She states this pain has been triggered by certain foods in the past as well.  She reports nausea, without vomiting.  She describes a sharp pain across her upper abdomen and into the lower ribs.  She denies injury, cough, lower abdominal pain, fever, back pain and shortness of breath.     Past Medical History  Diagnosis Date  . Asthma    History reviewed. No pertinent past surgical history. No family history on file. History  Substance Use Topics  . Smoking status: Never Smoker   . Smokeless tobacco: Not on file  . Alcohol Use: No   OB History    No data available     Review of Systems  Constitutional: Negative for fever, chills and appetite change.  Respiratory: Negative for chest tightness and shortness of breath.   Cardiovascular: Negative for chest pain.  Gastrointestinal: Positive for nausea and abdominal pain. Negative for vomiting, diarrhea, constipation, blood in stool and abdominal distention.  Genitourinary: Negative for dysuria, flank pain, decreased urine volume and difficulty urinating.  Musculoskeletal: Negative for back pain.  Skin: Negative for color change and rash.  Neurological: Negative for dizziness, weakness and numbness.  Hematological: Negative for adenopathy.  All other systems reviewed and are negative.     Allergies  Latex  Home Medications   Prior to Admission medications   Medication Sig Start Date End Date  Taking? Authorizing Provider  norethindrone-ethinyl estradiol (MICROGESTIN,JUNEL,LOESTRIN) 1-20 MG-MCG tablet Take 1 tablet by mouth daily.   Yes Historical Provider, MD  oxyCODONE-acetaminophen (PERCOCET) 5-325 MG per tablet Take 1 tablet by mouth every 4 (four) hours as needed. Patient not taking: Reported on 08/30/2014 08/11/13   Donnetta Hutching, MD  promethazine (PHENERGAN) 25 MG tablet Take 1 tablet (25 mg total) by mouth every 6 (six) hours as needed for nausea or vomiting. Patient not taking: Reported on 08/30/2014 08/11/13   Donnetta Hutching, MD   BP 137/92 mmHg  Pulse 86  Temp(Src) 98.1 F (36.7 C) (Oral)  Resp 24  Ht 5' 2.5" (1.588 m)  SpO2 100%  LMP 01/12/2015 Physical Exam  Constitutional: She is oriented to person, place, and time. She appears well-developed and well-nourished. No distress.  HENT:  Head: Normocephalic and atraumatic.  Mouth/Throat: Oropharynx is clear and moist.  Cardiovascular: Normal rate, regular rhythm, normal heart sounds and intact distal pulses.   No murmur heard. Pulmonary/Chest: Effort normal and breath sounds normal. No respiratory distress.  Abdominal: Soft. Normal appearance and bowel sounds are normal. She exhibits no distension and no mass. There is tenderness in the epigastric area. There is no rigidity, no rebound, no guarding, no CVA tenderness and no tenderness at McBurney's point.    Musculoskeletal: Normal range of motion. She exhibits no edema.  Neurological: She is alert and oriented to person, place, and time. She exhibits normal muscle tone. Coordination normal.  Skin: Skin is warm and dry.  Psychiatric: She has a normal mood and affect.  Nursing  note and vitals reviewed.   ED Course  Procedures (including critical care time) Labs Review Labs Reviewed  CBC WITH DIFFERENTIAL/PLATELET - Abnormal; Notable for the following:    Platelets 106 (*)    All other components within normal limits  COMPREHENSIVE METABOLIC PANEL - Abnormal; Notable  for the following:    CO2 21 (*)    ALT 11 (*)    All other components within normal limits  PREGNANCY, URINE  URINALYSIS, ROUTINE W REFLEX MICROSCOPIC (NOT AT [facility])  LIPASE, BLOOD    Imaging Review US Abdomen Limited Ruq  02/18/2015   CLINICAL DATA:  Right upper quadrant and epigastric abdominal pain with nausea. Duration of symptoms 1 day.  EXAM: US ABDOMEN LIMITED - RIGHT UPPER QUADRANT  COMPARISON:  01/13/2011  FINDINGS: Gallbladder:  Multiple small stones in the gallbladder, primarily in the fundus. Mild gallbladder wall thickening but no pericholecystic fluid. No sonographic Murphy's sign.  Common bile duct:  Diameter: Normal at 3.4 mm.  Liver:  Normal  IMPRESSION: Multiple stones in the gallbladder with mild gallbladder wall thickening. The findings could go along with early cholecystitis. No definite positive Murphy sign.   Electronically Signed   By: Paulina Fusi M.D.   On: 02/18/2015 13:54     EKG Interpretation None    1:15 npo4 6 hrs  MDM   Final diagnoses:  Upper abdominal pain    Patient is scheduled to return here tomorrow for US of the abdomen.  She is feeling better after GI coctail.  No hx of vomiting, fever.  No clinical suspicion for acute abdomen at this time.  She is scheduled to return here at 1:15 pm 02/18/15.  Advised to be NPO 4-6 hrs prior to exam.    Pauline Aus, PA-C 02/19/15 0036  Rolland Porter, MD 02/25/15 2040

## 2015-02-18 ENCOUNTER — Ambulatory Visit (HOSPITAL_COMMUNITY)
Admit: 2015-02-18 | Discharge: 2015-02-18 | Disposition: A | Discharge status: Home / Self Care | Payer: Medicaid Other | Source: Ambulatory Visit | Attending: Obstetrics and Gynecology | Admitting: Obstetrics and Gynecology

## 2015-02-18 ENCOUNTER — Other Ambulatory Visit (HOSPITAL_COMMUNITY): Payer: Self-pay | Admitting: Emergency Medicine

## 2015-02-18 DIAGNOSIS — R1013 Epigastric pain: Secondary | ICD-10-CM

## 2015-02-18 DIAGNOSIS — R1011 Right upper quadrant pain: Secondary | ICD-10-CM | POA: Insufficient documentation

## 2015-02-18 DIAGNOSIS — R110 Nausea: Secondary | ICD-10-CM | POA: Diagnosis not present

## 2015-02-18 LAB — CBC WITH DIFFERENTIAL/PLATELET
BASOS ABS: 0 10*3/uL (ref 0.0–0.1)
BASOS PCT: 0 % (ref 0–1)
Eosinophils Absolute: 0.1 10*3/uL (ref 0.0–0.7)
Eosinophils Relative: 1 % (ref 0–5)
HEMATOCRIT: 40.2 % (ref 36.0–46.0)
HEMOGLOBIN: 13.8 g/dL (ref 12.0–15.0)
Lymphocytes Relative: 32 % (ref 12–46)
Lymphs Abs: 3.2 10*3/uL (ref 0.7–4.0)
MCH: 29.2 pg (ref 26.0–34.0)
MCHC: 34.3 g/dL (ref 30.0–36.0)
MCV: 85.2 fL (ref 78.0–100.0)
MONO ABS: 0.5 10*3/uL (ref 0.1–1.0)
Monocytes Relative: 5 % (ref 3–12)
NEUTROS ABS: 6.1 10*3/uL (ref 1.7–7.7)
Neutrophils Relative %: 62 % (ref 43–77)
Platelets: 106 10*3/uL — ABNORMAL LOW (ref 150–400)
RBC: 4.72 MIL/uL (ref 3.87–5.11)
RDW: 13.5 % (ref 11.5–15.5)
WBC: 9.9 10*3/uL (ref 4.0–10.5)

## 2015-02-18 LAB — COMPREHENSIVE METABOLIC PANEL
ALK PHOS: 48 U/L (ref 38–126)
ALT: 11 U/L — ABNORMAL LOW (ref 14–54)
ANION GAP: 11 (ref 5–15)
AST: 17 U/L (ref 15–41)
Albumin: 4.5 g/dL (ref 3.5–5.0)
BILIRUBIN TOTAL: 0.4 mg/dL (ref 0.3–1.2)
BUN: 9 mg/dL (ref 6–20)
CHLORIDE: 108 mmol/L (ref 101–111)
CO2: 21 mmol/L — ABNORMAL LOW (ref 22–32)
Calcium: 9.5 mg/dL (ref 8.9–10.3)
Creatinine, Ser: 0.78 mg/dL (ref 0.44–1.00)
GFR calc Af Amer: >60 mL/min (ref >60)
GLUCOSE: 93 mg/dL (ref 65–99)
Potassium: 4.1 mmol/L (ref 3.5–5.1)
Sodium: 140 mmol/L (ref 135–145)
Total Protein: 7.7 g/dL (ref 6.5–8.1)

## 2015-02-18 LAB — LIPASE, BLOOD: LIPASE: 31 U/L (ref 22–51)

## 2015-02-18 MED ORDER — HYDROCODONE-ACETAMINOPHEN 5-325 MG PO TABS: ORAL_TABLET | ORAL | Status: DC

## 2015-02-18 MED ORDER — FAMOTIDINE 20 MG PO TABS: 20.0000 mg | ORAL_TABLET | Freq: Two times a day (BID) | ORAL | Status: DC

## 2015-02-18 NOTE — Discharge Instructions (Signed)

## 2015-02-18 NOTE — ED Notes (Signed)
Discharge instructions given, pt demonstrated teach back and verbal understanding. No concerns voiced. [place] scheduled for tomorrow. Time and instructions given for pt to be prepared for test.

## 2016-06-17 ENCOUNTER — Emergency Department (HOSPITAL_COMMUNITY)
Admission: EM | Admit: 2016-06-17 | Discharge: 2016-06-17 | Disposition: A | Discharge status: Home / Self Care | Payer: Medicaid Other | Attending: Emergency Medicine | Admitting: Emergency Medicine

## 2016-06-17 ENCOUNTER — Encounter (HOSPITAL_COMMUNITY): Payer: Self-pay | Admitting: Emergency Medicine

## 2016-06-17 DIAGNOSIS — J45909 Unspecified asthma, uncomplicated: Secondary | ICD-10-CM | POA: Insufficient documentation

## 2016-06-17 DIAGNOSIS — Z791 Long term (current) use of non-steroidal anti-inflammatories (NSAID): Secondary | ICD-10-CM | POA: Insufficient documentation

## 2016-06-17 DIAGNOSIS — J020 Streptococcal pharyngitis: Secondary | ICD-10-CM | POA: Diagnosis not present

## 2016-06-17 DIAGNOSIS — Z792 Long term (current) use of antibiotics: Secondary | ICD-10-CM | POA: Insufficient documentation

## 2016-06-17 DIAGNOSIS — J029 Acute pharyngitis, unspecified: Secondary | ICD-10-CM | POA: Diagnosis present

## 2016-06-17 LAB — RAPID STREP SCREEN (MED CTR MEBANE ONLY): Streptococcus, Group A Screen (Direct): POSITIVE — AB

## 2016-06-17 MED ORDER — AMOXICILLIN 500 MG PO CAPS: 500.0000 mg | ORAL_CAPSULE | Freq: Three times a day (TID) | ORAL | 0 refills | Status: DC

## 2016-06-17 MED ORDER — IBUPROFEN 800 MG PO TABS: 800.0000 mg | ORAL_TABLET | Freq: Three times a day (TID) | ORAL | 0 refills | Status: DC

## 2016-06-17 NOTE — Discharge Instructions (Signed)
Drink plenty of fluids. Follow-up with your primary doctor for recheck. Return here for any worsening symptoms

## 2016-06-17 NOTE — ED Triage Notes (Signed)
Had sore throat for last 3 days.  Rates pain 8/10 with swallowing.

## 2016-06-21 NOTE — ED Provider Notes (Signed)
[facility]-EMERGENCY DEPT Provider Note   CSN: [id number] Arrival date & time: 06/17/16  1357     History   Chief Complaint Chief Complaint  Patient presents with  . Sore Throat    HPI Renee Matthews is a 20 y.o. female.  HPI Renee Matthews is a 20 y.o. female who presents to the Emergency Department complaining of sore throat for 3 days.  She describes pain with swallowing and general malaise.  She has associated chills without known fever.  She has taken OTC analgesics without relief.  She denies abdominal pain, rash, vomiting, neck pain or stiffness.   Past Medical History:  Diagnosis Date  . Asthma     There are no active problems to display for this patient.   No past surgical history on file.  OB History    Gravida Para Term Preterm AB Living   1             SAB TAB Ectopic Multiple Live Births                   Home Medications    Prior to Admission medications   Medication Sig Start Date End Date Taking? Authorizing Provider  amoxicillin (AMOXIL) 500 MG capsule Take 1 capsule (500 mg total) by mouth 3 (three) times daily. 06/17/16   Kamauri Denardo, PA-C  ibuprofen (ADVIL,MOTRIN) 800 MG tablet Take 1 tablet (800 mg total) by mouth 3 (three) times daily. Take with food 06/17/16   Alainna Stawicki, PA-C    Family History No family history on file.  Social History Social History  Substance Use Topics  . Smoking status: Never Smoker  . Smokeless tobacco: Never Used  . Alcohol use No     Allergies   Latex   Review of Systems Review of Systems  Constitutional: Positive for chills. Negative for activity change, appetite change and fever.  HENT: Positive for sore throat. Negative for congestion, ear pain, facial swelling, trouble swallowing and voice change.   Eyes: Negative for pain and visual disturbance.  Respiratory: Negative for cough and shortness of breath.   Gastrointestinal: Negative for abdominal pain, nausea and vomiting.  Genitourinary:  Negative for dysuria.  Musculoskeletal: Negative for arthralgias, neck pain and neck stiffness.  Skin: Negative for color change and rash.  Neurological: Negative for dizziness, facial asymmetry, speech difficulty, numbness and headaches.  Hematological: Negative for adenopathy.  All other systems reviewed and are negative.    Physical Exam Updated Vital Signs BP 125/86 (BP Location: Left Arm)   Pulse 86   Temp 98.3 F (36.8 C) (Oral)   Resp 17   Ht 5\' 2"  (1.575 m)   Wt 63.5 kg   LMP 05/27/2016 (Approximate)   SpO2 100%   BMI 25.61 kg/m   Physical Exam  Constitutional: She is oriented to person, place, and time. She appears well-developed and well-nourished. No distress.  HENT:  Right Ear: Tympanic membrane and ear canal normal.  Left Ear: Tympanic membrane and ear canal normal.  Mouth/Throat: Uvula is midline and mucous membranes are normal. Posterior oropharyngeal erythema present. No posterior oropharyngeal edema or tonsillar abscesses. Tonsils are 2+ on the right. Tonsils are 2+ on the left. No tonsillar exudate.  Cardiovascular: Normal rate and regular rhythm.   No murmur heard. Pulmonary/Chest: Effort normal and breath sounds normal. No respiratory distress.  Abdominal: Soft. She exhibits no distension. There is no tenderness.  Musculoskeletal: Normal range of motion.  Lymphadenopathy:    She has cervical  adenopathy.  Neurological: She is alert and oriented to person, place, and time.  Skin: Skin is warm. No rash noted.  Nursing note and vitals reviewed.    ED Treatments / Results  Labs (all labs ordered are listed, but only abnormal results are displayed) Labs Reviewed  RAPID STREP SCREEN (NOT AT [facility]) - Abnormal; Notable for the following:       Result Value   Streptococcus, Group A Screen (Direct) POSITIVE (*)    All other components within normal limits    EKG  EKG Interpretation None       Radiology No results found.  Procedures Procedures  (including critical care time)  Medications Ordered in ED Medications - No data to display   Initial Impression / Assessment and Plan / ED Course  I have reviewed the triage vital signs and the nursing notes.  Pertinent labs & imaging results that were available during my care of the patient were reviewed by me and considered in my medical decision making (see chart for details).  Clinical Course   Pt well appearing, non-toxic.  Airway patent.  No concerning sx's for PTA.    The patient appears reasonably screened and/or stabilized for discharge and I doubt any other medical condition or other [facility] requiring further screening, evaluation, or treatment in the ED at this time prior to discharge.   Final Clinical Impressions(s) / ED Diagnoses   Final diagnoses:  Strep pharyngitis    New Prescriptions Discharge Medication List as of 06/17/2016  3:58 PM    START taking these medications   Details  amoxicillin (AMOXIL) 500 MG capsule Take 1 capsule (500 mg total) by mouth 3 (three) times daily., Starting Wed 06/17/2016, Print    ibuprofen (ADVIL,MOTRIN) 800 MG tablet Take 1 tablet (800 mg total) by mouth 3 (three) times daily. Take with food, Starting Wed 06/17/2016, Print         Pauline Ausammy Henson Fraticelli, PA-C 06/21/16 1450    Lavera Guiseana Duo Liu, MD 06/23/16 [phone]

## 2016-08-03 LAB — OB RESULTS CONSOLE ABO/RH: RH TYPE: POSITIVE

## 2016-08-03 LAB — OB RESULTS CONSOLE ANTIBODY SCREEN: Antibody Screen: NEGATIVE

## 2016-08-03 LAB — OB RESULTS CONSOLE RPR: RPR: NONREACTIVE

## 2016-08-03 LAB — OB RESULTS CONSOLE HIV ANTIBODY (ROUTINE TESTING): HIV: NONREACTIVE

## 2016-08-03 LAB — OB RESULTS CONSOLE HEPATITIS B SURFACE ANTIGEN: HEP B S AG: NEGATIVE

## 2016-08-03 LAB — OB RESULTS CONSOLE RUBELLA ANTIBODY, IGM: RUBELLA: IMMUNE

## 2016-08-24 NOTE — L&D Delivery Note (Signed)
Delivery Note At 11:25 AM a viable female was delivered via  (Presentation:ROP ;  ).  APGAR: 9,9 , ; weight pending  .   Placenta status:delivered, intact, shultz , .  Cord: 3vc  with the following complications:nuchal x 1 loose .  Cord pH: n/a  Anesthesia:  Epidural Episiotomy:  none Lacerations: none  Suture Repair: n/a Est. Blood Loss (mL):  250ml  Mom to postpartum.  Baby to Couplet care / Skin to Skin  Desires circ in office.  Cathrine MusterCecilia W Banga 03/04/2017, 11:32 AM

## 2017-02-11 LAB — OB RESULTS CONSOLE GC/CHLAMYDIA
CHLAMYDIA, DNA PROBE: NEGATIVE
Gonorrhea: NEGATIVE

## 2017-02-11 LAB — OB RESULTS CONSOLE GBS: GBS: NEGATIVE

## 2017-03-03 ENCOUNTER — Inpatient Hospital Stay (HOSPITAL_COMMUNITY)
Admission: AD | Admit: 2017-03-03 | Discharge: 2017-03-06 | DRG: 775 | Disposition: A | Discharge status: Home / Self Care | Payer: Medicaid Other | Source: Ambulatory Visit | Attending: Obstetrics and Gynecology | Admitting: Obstetrics and Gynecology

## 2017-03-03 DIAGNOSIS — O4290 Premature rupture of membranes, unspecified as to length of time between rupture and onset of labor, unspecified weeks of gestation: Secondary | ICD-10-CM

## 2017-03-03 DIAGNOSIS — F419 Anxiety disorder, unspecified: Secondary | ICD-10-CM | POA: Diagnosis present

## 2017-03-03 DIAGNOSIS — Z3A38 38 weeks gestation of pregnancy: Secondary | ICD-10-CM

## 2017-03-03 DIAGNOSIS — O26813 Pregnancy related exhaustion and fatigue, third trimester: Secondary | ICD-10-CM | POA: Diagnosis present

## 2017-03-03 DIAGNOSIS — O43123 Velamentous insertion of umbilical cord, third trimester: Principal | ICD-10-CM | POA: Diagnosis present

## 2017-03-03 DIAGNOSIS — O99344 Other mental disorders complicating childbirth: Secondary | ICD-10-CM | POA: Diagnosis present

## 2017-03-03 HISTORY — DX: Premature rupture of membranes, unspecified as to length of time between rupture and onset of labor, unspecified weeks of gestation: O42.90

## 2017-03-04 ENCOUNTER — Encounter (HOSPITAL_COMMUNITY): Payer: Self-pay

## 2017-03-04 ENCOUNTER — Inpatient Hospital Stay (HOSPITAL_COMMUNITY): Payer: Medicaid Other | Admitting: Anesthesiology

## 2017-03-04 DIAGNOSIS — O4290 Premature rupture of membranes, unspecified as to length of time between rupture and onset of labor, unspecified weeks of gestation: Secondary | ICD-10-CM

## 2017-03-04 DIAGNOSIS — O99344 Other mental disorders complicating childbirth: Secondary | ICD-10-CM | POA: Diagnosis present

## 2017-03-04 DIAGNOSIS — O26813 Pregnancy related exhaustion and fatigue, third trimester: Secondary | ICD-10-CM | POA: Diagnosis present

## 2017-03-04 DIAGNOSIS — O26893 Other specified pregnancy related conditions, third trimester: Secondary | ICD-10-CM | POA: Diagnosis present

## 2017-03-04 DIAGNOSIS — Z3A38 38 weeks gestation of pregnancy: Secondary | ICD-10-CM | POA: Diagnosis not present

## 2017-03-04 DIAGNOSIS — F419 Anxiety disorder, unspecified: Secondary | ICD-10-CM | POA: Diagnosis present

## 2017-03-04 DIAGNOSIS — O43123 Velamentous insertion of umbilical cord, third trimester: Secondary | ICD-10-CM | POA: Diagnosis present

## 2017-03-04 HISTORY — DX: Premature rupture of membranes, unspecified as to length of time between rupture and onset of labor, unspecified weeks of gestation: O42.90

## 2017-03-04 LAB — CBC
HEMATOCRIT: 34.4 % — AB (ref 36.0–46.0)
Hemoglobin: 11.6 g/dL — ABNORMAL LOW (ref 12.0–15.0)
MCH: 28.4 pg (ref 26.0–34.0)
MCHC: 33.7 g/dL (ref 30.0–36.0)
MCV: 84.1 fL (ref 78.0–100.0)
Platelets: 305 10*3/uL (ref 150–400)
RBC: 4.09 MIL/uL (ref 3.87–5.11)
RDW: 14.1 % (ref 11.5–15.5)
WBC: 11.5 10*3/uL — ABNORMAL HIGH (ref 4.0–10.5)

## 2017-03-04 LAB — POCT FERN TEST: POCT Fern Test: POSITIVE

## 2017-03-04 LAB — ABO/RH: ABO/RH(D): B POS

## 2017-03-04 LAB — RPR: RPR Ser Ql: NONREACTIVE

## 2017-03-04 LAB — TYPE AND SCREEN
ABO/RH(D): B POS
ANTIBODY SCREEN: NEGATIVE

## 2017-03-04 MED ORDER — PHENYLEPHRINE 40 MCG/ML (10ML) SYRINGE FOR IV PUSH (FOR BLOOD PRESSURE SUPPORT)
PREFILLED_SYRINGE | INTRAVENOUS | Status: AC
  Filled 2017-03-04: qty 10

## 2017-03-04 MED ORDER — PHENYLEPHRINE 40 MCG/ML (10ML) SYRINGE FOR IV PUSH (FOR BLOOD PRESSURE SUPPORT)
80.0000 ug | PREFILLED_SYRINGE | INTRAVENOUS | Status: DC | PRN
  Filled 2017-03-04: qty 5

## 2017-03-04 MED ORDER — TETANUS-DIPHTH-ACELL PERTUSSIS 5-2.5-18.5 LF-MCG/0.5 IM SUSP: 0.5000 mL | Freq: Once | INTRAMUSCULAR | Status: DC

## 2017-03-04 MED ORDER — OXYTOCIN BOLUS FROM INFUSION: 500.0000 mL | Freq: Once | INTRAVENOUS | Status: DC

## 2017-03-04 MED ORDER — OXYCODONE-ACETAMINOPHEN 5-325 MG PO TABS: 1.0000 | ORAL_TABLET | ORAL | Status: DC | PRN

## 2017-03-04 MED ORDER — IBUPROFEN 600 MG PO TABS
600.0000 mg | ORAL_TABLET | Freq: Four times a day (QID) | ORAL | Status: DC
  Administered 2017-03-04 – 2017-03-06 (×8): 600 mg via ORAL
  Filled 2017-03-04 (×8): qty 1

## 2017-03-04 MED ORDER — OXYTOCIN 40 UNITS IN LACTATED RINGERS INFUSION - SIMPLE MED
1.0000 m[IU]/min | INTRAVENOUS | Status: DC
  Administered 2017-03-04: 2 m[IU]/min via INTRAVENOUS

## 2017-03-04 MED ORDER — SENNOSIDES-DOCUSATE SODIUM 8.6-50 MG PO TABS
2.0000 | ORAL_TABLET | ORAL | Status: DC
  Administered 2017-03-04: 2 via ORAL
  Filled 2017-03-04: qty 2

## 2017-03-04 MED ORDER — ONDANSETRON HCL 4 MG/2ML IJ SOLN: 4.0000 mg | INTRAMUSCULAR | Status: DC | PRN

## 2017-03-04 MED ORDER — LACTATED RINGERS IV SOLN
500.0000 mL | INTRAVENOUS | Status: DC | PRN
Start: 2017-03-04 — End: 2017-03-04

## 2017-03-04 MED ORDER — ACETAMINOPHEN 325 MG PO TABS: 650.0000 mg | ORAL_TABLET | ORAL | Status: DC | PRN

## 2017-03-04 MED ORDER — OXYCODONE HCL 5 MG PO TABS: 5.0000 mg | ORAL_TABLET | ORAL | Status: DC | PRN

## 2017-03-04 MED ORDER — OXYCODONE-ACETAMINOPHEN 5-325 MG PO TABS: 2.0000 | ORAL_TABLET | ORAL | Status: DC | PRN

## 2017-03-04 MED ORDER — SOD CITRATE-CITRIC ACID 500-334 MG/5ML PO SOLN: 30.0000 mL | ORAL | Status: DC | PRN

## 2017-03-04 MED ORDER — ONDANSETRON HCL 4 MG/2ML IJ SOLN: 4.0000 mg | Freq: Four times a day (QID) | INTRAMUSCULAR | Status: DC | PRN

## 2017-03-04 MED ORDER — LACTATED RINGERS IV SOLN
500.0000 mL | Freq: Once | INTRAVENOUS | Status: AC
  Administered 2017-03-04: 500 mL via INTRAVENOUS

## 2017-03-04 MED ORDER — FENTANYL 2.5 MCG/ML BUPIVACAINE 1/10 % EPIDURAL INFUSION (WH - ANES)
14.0000 mL/h | INTRAMUSCULAR | Status: DC | PRN
  Administered 2017-03-04: 14 mL/h via EPIDURAL

## 2017-03-04 MED ORDER — FENTANYL 2.5 MCG/ML BUPIVACAINE 1/10 % EPIDURAL INFUSION (WH - ANES)
INTRAMUSCULAR | Status: AC
  Filled 2017-03-04: qty 100

## 2017-03-04 MED ORDER — OXYCODONE HCL 5 MG PO TABS: 10.0000 mg | ORAL_TABLET | ORAL | Status: DC | PRN

## 2017-03-04 MED ORDER — LIDOCAINE HCL (PF) 1 % IJ SOLN
30.0000 mL | INTRAMUSCULAR | Status: DC | PRN
  Filled 2017-03-04: qty 30

## 2017-03-04 MED ORDER — EPHEDRINE 5 MG/ML INJ
10.0000 mg | INTRAVENOUS | Status: DC | PRN
  Filled 2017-03-04: qty 2

## 2017-03-04 MED ORDER — LIDOCAINE HCL (PF) 1 % IJ SOLN
INTRAMUSCULAR | Status: DC | PRN
  Administered 2017-03-04 (×2): 7 mL via EPIDURAL

## 2017-03-04 MED ORDER — OXYTOCIN 40 UNITS IN LACTATED RINGERS INFUSION - SIMPLE MED
INTRAVENOUS | Status: AC
  Filled 2017-03-04: qty 1000

## 2017-03-04 MED ORDER — BENZOCAINE-MENTHOL 20-0.5 % EX AERO
1.0000 "application " | INHALATION_SPRAY | CUTANEOUS | Status: DC | PRN
  Administered 2017-03-04: 1 via TOPICAL
  Filled 2017-03-04: qty 56

## 2017-03-04 MED ORDER — WITCH HAZEL-GLYCERIN EX PADS: 1.0000 "application " | MEDICATED_PAD | CUTANEOUS | Status: DC | PRN

## 2017-03-04 MED ORDER — ACETAMINOPHEN 325 MG PO TABS
650.0000 mg | ORAL_TABLET | ORAL | Status: DC | PRN
  Administered 2017-03-04 – 2017-03-05 (×3): 650 mg via ORAL
  Filled 2017-03-04 (×3): qty 2

## 2017-03-04 MED ORDER — SIMETHICONE 80 MG PO CHEW: 80.0000 mg | CHEWABLE_TABLET | ORAL | Status: DC | PRN

## 2017-03-04 MED ORDER — ONDANSETRON HCL 4 MG PO TABS: 4.0000 mg | ORAL_TABLET | ORAL | Status: DC | PRN

## 2017-03-04 MED ORDER — ZOLPIDEM TARTRATE 5 MG PO TABS: 5.0000 mg | ORAL_TABLET | Freq: Every evening | ORAL | Status: DC | PRN

## 2017-03-04 MED ORDER — DIPHENHYDRAMINE HCL 25 MG PO CAPS: 25.0000 mg | ORAL_CAPSULE | Freq: Four times a day (QID) | ORAL | Status: DC | PRN

## 2017-03-04 MED ORDER — FLEET ENEMA 7-19 GM/118ML RE ENEM: 1.0000 | ENEMA | RECTAL | Status: DC | PRN

## 2017-03-04 MED ORDER — DIPHENHYDRAMINE HCL 50 MG/ML IJ SOLN: 12.5000 mg | INTRAMUSCULAR | Status: DC | PRN

## 2017-03-04 MED ORDER — PRENATAL MULTIVITAMIN CH
1.0000 | ORAL_TABLET | Freq: Every day | ORAL | Status: DC
  Administered 2017-03-05 – 2017-03-06 (×2): 1 via ORAL
  Filled 2017-03-04 (×2): qty 1

## 2017-03-04 MED ORDER — TERBUTALINE SULFATE 1 MG/ML IJ SOLN
0.2500 mg | Freq: Once | INTRAMUSCULAR | Status: DC | PRN
  Filled 2017-03-04: qty 1

## 2017-03-04 MED ORDER — BUTORPHANOL TARTRATE 1 MG/ML IJ SOLN
1.0000 mg | INTRAMUSCULAR | Status: DC | PRN
Start: 2017-03-04 — End: 2017-03-04
  Administered 2017-03-04: 1 mg via INTRAVENOUS
  Filled 2017-03-04: qty 1

## 2017-03-04 MED ORDER — OXYTOCIN 40 UNITS IN LACTATED RINGERS INFUSION - SIMPLE MED: 2.5000 [IU]/h | INTRAVENOUS | Status: DC

## 2017-03-04 MED ORDER — DIBUCAINE 1 % RE OINT: 1.0000 "application " | TOPICAL_OINTMENT | RECTAL | Status: DC | PRN

## 2017-03-04 MED ORDER — LACTATED RINGERS IV SOLN
INTRAVENOUS | Status: DC
  Administered 2017-03-04 (×2): via INTRAVENOUS

## 2017-03-04 MED ORDER — COCONUT OIL OIL
1.0000 "application " | TOPICAL_OIL | Status: DC | PRN
  Administered 2017-03-05: 1 via TOPICAL
  Filled 2017-03-04: qty 120

## 2017-03-04 NOTE — Progress Notes (Signed)
Patient ID: Renee Matthews, female   DOB: [birth year], 21 y.o.   MRN: [id number] Late entry Pt complete at 10am and with uncontrollable urge to push.  We pushed for 30mins with mild descent and maternal fatigue. Pt allowed to labor down for 15 mins Plan to resume pushing now and anticipate svd without comps

## 2017-03-04 NOTE — H&P (Signed)
Renee Matthews is a 21 y.o. female G2P1001 at 38+ with ROM for clear fluid last evening.  Came to MAU, ROM confirmed.  [facility]  Received Tdap 12/28/16.  Relatively uncomplicated prenatal care.    OB History    Gravida Para Term Preterm AB Living   2 1 1     1    SAB TAB Ectopic Multiple Live Births           1    G1 40wk SVD 6#10, female  G2 present  No abn pap No STD  Past Medical History:  Diagnosis Date  . Asthma   . ROM (rupture of membranes), premature 03/04/2017  anxiety - well controlled Gallbladder stones  PSH - WTE  Family History: family history includes Cancer in her maternal grandfather; Diabetes in her maternal grandmother; Heart disease in her maternal grandmother; Hypertension in her maternal aunt. Social History:  reports that she has never smoked. She has never used smokeless tobacco. She reports that she does not drink alcohol or use drugs.waitress, single - in relationship  Meds PNV All NKDA, red food color     Maternal Diabetes: No Genetic Screening: Normal Maternal Ultrasounds/Referrals: Normal Fetal Ultrasounds or other Referrals:  None Maternal Substance Abuse:  No Significant Maternal Medications:  None Significant Maternal Lab Results:  Lab values include: Group B Strep negative Other Comments:  None  Review of Systems  Constitutional: Negative.   HENT: Negative.   Eyes: Negative.   Respiratory: Negative.   Cardiovascular: Negative.   Gastrointestinal: Negative.   Genitourinary: Negative.   Musculoskeletal: Negative.   Skin: Negative.   Neurological: Negative.   Psychiatric/Behavioral: Negative.    Maternal Medical History:  Reason for admission: Rupture of membranes.   Contractions: Frequency: irregular.    Fetal activity: Perceived fetal activity is normal.    Prenatal Complications - Diabetes: none.    Dilation: 1 Effacement (%): 50 Station: -3 Exam by:: Renee Matthews Blood pressure  120/63, pulse 78, temperature 98.2 F (36.8 C), temperature source Oral, resp. rate 18, height 5\' 3"  (1.6 m), weight 80.3 kg (177 lb), last menstrual period 06/10/2016. Maternal Exam:  Abdomen: Fundal height is appropriate for gestation.   Estimated fetal weight is 7#.   Fetal presentation: vertex  Introitus: Normal vulva. Normal vagina.    Physical Exam  Constitutional: She is oriented to person, place, and time. She appears well-developed and well-nourished.  HENT:  Head: Normocephalic and atraumatic.  Cardiovascular: Normal rate and regular rhythm.   Respiratory: Breath sounds normal. No respiratory distress. She has no wheezes.  GI: Soft. Bowel sounds are normal. She exhibits no distension. There is no tenderness.  Musculoskeletal: Normal range of motion.  Neurological: She is alert and oriented to person, place, and time.  Skin: Skin is warm and dry.  Psychiatric: She has a normal mood and affect. Her behavior is normal.    Prenatal labs: ABO, Rh: --/--/B POS, B POS (07/12 0045) Antibody: NEG (07/12 0045) Rubella: Immune (12/11 0000) RPR: Nonreactive (12/11 0000)  HBsAg: Negative (12/11 0000)  HIV: Non-reactive (12/11 0000)  GBS: Negative (06/21 0000)   Hgb 14.4/Plt 353/Ur Cx neg/ Chl neg/GC neg/Pap WNL/First trimester screen WNL - nl NT/glucola 96  US nl anat, female, ant plac - marginal cord insertion  Assessment/Plan: 20yo G2P1001 at 7238 with ROM gbbs neg no prophylaxis Pitocin to augment Epidural/nitrous or IV pain meds for pain control Expect SVD    Renee Matthews 03/04/2017, 6:31  AM    

## 2017-03-04 NOTE — Anesthesia Preprocedure Evaluation (Signed)
Anesthesia Evaluation  Patient identified by MRN, date of birth, ID band Patient awake    Reviewed: Allergy & Precautions, H&P , NPO status , Patient's Chart, lab work & pertinent test results  Airway Mallampati: II  TM Distance: >3 FB Neck ROM: full    Dental no notable dental hx.    Pulmonary neg pulmonary ROS,    Pulmonary exam normal        Cardiovascular negative cardio ROS Normal cardiovascular exam Rhythm:regular Rate:Normal     Neuro/Psych negative neurological ROS  negative psych ROS   GI/Hepatic negative GI ROS, Neg liver ROS,   Endo/Other  negative endocrine ROS  Renal/GU negative Renal ROS     Musculoskeletal negative musculoskeletal ROS (+)   Abdominal (+) + obese,   Peds  Hematology negative hematology ROS (+)   Anesthesia Other Findings   Reproductive/Obstetrics (+) Pregnancy                             Anesthesia Physical Anesthesia Plan  ASA: II  Anesthesia Plan: Epidural   Post-op Pain Management:    Induction:   PONV Risk Score and Plan:   Airway Management Planned:   Additional Equipment:   Intra-op Plan:   Post-operative Plan:   Informed Consent: I have reviewed the patients History and Physical, chart, labs and discussed the procedure including the risks, benefits and alternatives for the proposed anesthesia with the patient or authorized representative who has indicated his/her understanding and acceptance.     Plan Discussed with:   Anesthesia Plan Comments:         Anesthesia Quick Evaluation

## 2017-03-04 NOTE — Progress Notes (Signed)
Patient ID: Renee Matthews, female   DOB: 1996/01/17, 21 y.o.   MRN: [id number]   Getting uncomfortable  AFVSS gen NAD FHTs 130's, mod var, +accels, category 1 toco q 2-3 min  SVE 2.3/80/-2  Continue IOL

## 2017-03-04 NOTE — Lactation Note (Signed)
This note was copied from a baby's chart. Lactation Consultation Note  Patient Name: Renee Matthews ZOXWR'UToday's Date: 03/04/2017 Reason for consult: Initial assessment   [facility] [place] [facility] Infant was latched and feeding when LC entered room. Infant off and on the breast. Enc mom to massage/compress breast with feeding. Weight is pending, infant is noted to be of smaller stature on visualization.   Mom with small cone shaped breasts with everted nipples. Mom suspected to have minimal milk producing cells in breasts based on size, shape and history with 89 yo. Mom reports she BF her 89 yo for 3 months, mom supplemented in the hospital and then exclusively BF and infant was losing weight so formula was started. Infant weight will need to be closely followed.   Enc mom to BF infant STS 8-12 x in 24 hours at first feeding cues for as long as infant desires, offering both breasts with each feeding. Discussed with mom that if infant is less than 6 pounds, he needs to be fed at least every 3 hours, awakening as needed. Reviewed colostrum, milk coming to volume, hand expression, spoon feeding after BF, positioning and BF basics. Feeding log given with instructions for use.   Worked with mom on hand expression, she was able to hand express a gtt from the right breast. Enc mom to hand express post BF and offer infant spoon feeding as EBM is available.   Mom asked about getting a pump, [place] [facility] [place]  BF Resources Handout and [facility]ch[facility] given, mom informed of IP/OP Services, BF Support Groups and LC phone #. Enc mom to call out for feeding assistance as needed.      Maternal Data Formula Feeding for Exclusion: No Has patient been taught Hand Expression?: Yes Does the patient have breastfeeding experience prior to this delivery?: Yes  Feeding Feeding Type: Breast Fed (off and on) Length of feed: 15 min (still  feeding when LC left the room)  LATCH Score/Interventions Latch: Repeated attempts needed to sustain latch, nipple held in mouth throughout feeding, stimulation needed to elicit sucking reflex. Intervention(s): Adjust position;Assist with latch;Breast massage;Breast compression  Audible Swallowing: A few with stimulation Intervention(s): Alternate breast massage;Hand expression;Skin to skin  Type of Nipple: Everted at rest and after stimulation  Comfort (Breast/Nipple): Soft / non-tender     Hold (Positioning): Assistance needed to correctly position infant at breast and maintain latch.  LATCH Score: 7  Lactation Tools Discussed/Used WIC Program: Yes [facility]   Consult Status Consult Status: Follow-up Date: 03/05/17 Follow-up type: In-patient    Silas FloodSharon S Hice 03/04/2017, 12:25 PM

## 2017-03-04 NOTE — MAU Note (Signed)
Pt here with c/o rupture of membranes about 2300. Was 1 cm on last exam. GBS negative

## 2017-03-04 NOTE — Anesthesia Pain Management Evaluation Note (Signed)
  [facility] Pain Management Visit Note  Patient: Renee Matthews, 21 y.o., female  "Hello I am a [facility]. We have an anesthesia team available at all times to provide care throughout the hospital, including epidural management and anesthesia for C-section. I don't know your plan for the delivery whether it a natural birth, water birth, IV sedation, nitrous supplementation, doula or epidural, but we want to meet your pain goals."   1.Was your pain managed to your expectations on prior hospitalizations?   epidural  2.What is your expectation for pain management during this hospitalization? Nursing support epidural       3.How can we help you reach that goal? epidural  Record the patient's initial score and the patient's pain goal.   Pain: 0/10  Pain Goal: 0/10 [facility] wants you to be able to say your pain was always managed very well.  Renee Matthews, Renee Matthews 03/04/2017

## 2017-03-04 NOTE — Anesthesia Procedure Notes (Signed)
Epidural Patient location during procedure: OB Start time: 03/04/2017 8:00 AM End time: 03/04/2017 8:04 AM  Staffing Anesthesiologist: Leilani AbleHATCHETT, Khole Branch Performed: anesthesiologist   Preanesthetic Checklist Completed: patient identified, surgical consent, pre-op evaluation, timeout performed, IV checked, risks and benefits discussed and monitors and equipment checked  Epidural Patient position: sitting Prep: site prepped and draped and DuraPrep Patient monitoring: continuous pulse ox and blood pressure Approach: midline Location: L3-L4 Injection technique: LOR air  Needle:  Needle type: Tuohy  Needle gauge: 17 G Needle length: 9 cm and 9 Needle insertion depth: 5 cm cm Catheter type: closed end flexible Catheter size: 19 Gauge Catheter at skin depth: 10 cm Test dose: negative and Other  Assessment Sensory level: T9 Events: blood not aspirated, injection not painful, no injection resistance, negative IV test and no paresthesia  Additional Notes Reason for block:procedure for pain

## 2017-03-05 LAB — CBC
HEMATOCRIT: 28.4 % — AB (ref 36.0–46.0)
HEMOGLOBIN: 9.8 g/dL — AB (ref 12.0–15.0)
MCH: 29.3 pg (ref 26.0–34.0)
MCHC: 34.5 g/dL (ref 30.0–36.0)
MCV: 85 fL (ref 78.0–100.0)
Platelets: 244 10*3/uL (ref 150–400)
RBC: 3.34 MIL/uL — AB (ref 3.87–5.11)
RDW: 14.3 % (ref 11.5–15.5)
WBC: 10.6 10*3/uL — AB (ref 4.0–10.5)

## 2017-03-05 LAB — BIRTH TISSUE RECOVERY COLLECTION (PLACENTA DONATION)

## 2017-03-05 NOTE — Progress Notes (Signed)
UR chart review completed.  

## 2017-03-05 NOTE — Lactation Note (Addendum)
This note was copied from a baby's chart. Lactation Consultation Note  Patient Name: Renee Matthews: 03/05/2017   Mom's anatomy suggests IGT. "Renee Matthews" was observed at the breast, but swallows were infrequent or not sustained (at one point, the suck: swallow ratio was 11:1). The swallowing frequency did increase with breast compression, but Mom does not seem to have much ductal tissue. Mom was shown signs/sounds of swallowing.   I gave option of setting her up with a DEBP to potentially increase her milk supply and/or the possibility of supplementing at the breast to increase frequency of swallows. Mom will give this some thought. I gave the RN an update. The RN had observed a reasonable amount of colostrum with hand expression earlier, so she may provide Mom with a couple of colostrum vials & encourage her to do hand expression. Lactation to f/u later.     Mom does have WIC. Lurline HareRichey, Silas Muff [facility] 03/05/2017, 3:15 PM

## 2017-03-05 NOTE — Progress Notes (Signed)
MOB was referred for history of depression/anxiety. * Referral screened out by Clinical Social Worker because none of the following criteria appear to apply: ~ History of anxiety/depression during this pregnancy, or of post-partum depression. ~ Diagnosis of anxiety and/or depression within last 3 years; MOB dx was around age 21. Per OB records, MOB's symptoms have been well controlled.  OR * MOB's symptoms currently being treated with medication and/or therapy.  Please contact the Clinical Social Worker if needs arise, or if MOB requests.  Carlita Whitcomb Boyd-Gilyard, MSW, LCSW Clinical Social Work [phone] 

## 2017-03-05 NOTE — Anesthesia Postprocedure Evaluation (Signed)
Anesthesia Post Note  Patient: Renee Matthews  Procedure(s) Performed: * No procedures listed *     Patient location during evaluation: Mother Baby Anesthesia Type: Epidural Level of consciousness: awake and alert, oriented and patient cooperative Pain management: pain level controlled Vital Signs Assessment: post-procedure vital signs reviewed and stable Respiratory status: spontaneous breathing Cardiovascular status: stable Postop Assessment: no headache, epidural receding, patient able to bend at knees and no signs of nausea or vomiting Anesthetic complications: no Comments: Pain Score 0.    Last Vitals:  Vitals:   03/04/17 1803 03/05/17 0634  BP: (!) 116/57 120/64  Pulse: 91 78  Resp: 16 18  Temp: 36.8 C 36.7 C    Last Pain:  Vitals:   03/05/17 0634  TempSrc: Oral  PainSc:    Pain Goal:                 [facility],Navya Timmons

## 2017-03-05 NOTE — Progress Notes (Signed)
Post Partum Day 1 Subjective: up ad lib, voiding and tolerating PO  Working on nursing   Objective: Blood pressure 120/64, pulse 78, temperature 98.1 F (36.7 C), temperature source Oral, resp. rate 18, height 5\' 3"  (1.6 m), weight 80.3 kg (177 lb), last menstrual period 06/10/2016, SpO2 99 %, unknown if currently breastfeeding.  Physical Exam:  General: alert and cooperative Lochia: appropriate Uterine Fundus: firm    Recent Labs  03/04/17 0045 03/05/17 0507  HGB 11.6* 9.8*  HCT 34.4* 28.4*    Assessment/Plan: Plan for discharge tomorrow  Plan circumcision in the office    LOS: 1 day   Oliver PilaKathy W Venisa Frampton 03/05/2017, 9:21 AM

## 2017-03-06 MED ORDER — ACETAMINOPHEN 325 MG PO TABS: 650.0000 mg | ORAL_TABLET | ORAL | 1 refills | Status: DC | PRN

## 2017-03-06 MED ORDER — IBUPROFEN 600 MG PO TABS: 600.0000 mg | ORAL_TABLET | Freq: Four times a day (QID) | ORAL | 0 refills | Status: DC

## 2017-03-06 NOTE — Plan of Care (Signed)
Problem: Nutritional: Goal: Mothers verbalization of comfort with breastfeeding process will improve Outcome: Completed/Met Date Met: 03/06/17 Pt verbalizes comfort with breastfeeding.  Reports she has some nipple soreness on the right side.  Bruising and redness noted on assessment.  Pt latched baby to the left d/t the soreness on the right and was able to latch with minimal difficulty.  Pt reports preferring the cradle hold.  Discussed the need to guide baby's head for the latch so that the baby would have a deep latch which would increase the milk he is able to transfer and would also help to prevent nipple soreness.  Pt verbalized understanding.  Encouraged pt to continue to call for assistance with latching.  Pt is also post pumping and giving the baby the breastmilk she gets via a bottle.

## 2017-03-06 NOTE — Lactation Note (Signed)
This note was copied from a baby's chart. Lactation Consultation Note  Patient Name: Renee Katina Degreelison Krempasky WUJWJ'XToday's Date: 03/06/2017  No discharge today due to elevated bilirubin.  Mom is putting baby to breast and then supplementing with expressed milk and formula.  Mom is pumping every 3 hours.  Instructed to call for assist/concerns today.   Maternal Data    Feeding    LATCH Score/Interventions                      Lactation Tools Discussed/Used     Consult Status      [name], [name] 03/06/2017, 9:51 AM

## 2017-03-06 NOTE — Progress Notes (Signed)
Post Partum Day 2 Subjective: up ad lib and tolerating PO  Pt tearful because baby may have to room in for jaundice  Objective: Blood pressure 120/62, pulse 70, temperature 98.5 F (36.9 C), temperature source Oral, resp. rate 18, height 5\' 3"  (1.6 m), weight 80.3 kg (177 lb), last menstrual period 06/10/2016, SpO2 99 %, unknown if currently breastfeeding.  Physical Exam:  General: alert and cooperative Lochia: appropriate Uterine Fundus: firm    Recent Labs  03/04/17 0045 03/05/17 0507  HGB 11.6* 9.8*  HCT 34.4* 28.4*    Assessment/Plan: Discharge home, will room in with baby if needed. Advised needs to go and get ibuprofen to take post discharge   LOS: 2 days   Oliver PilaKathy W Hurshel Bouillon 03/06/2017, 10:27 AM

## 2017-03-06 NOTE — Discharge Summary (Signed)
OB Discharge Summary     Patient Name: Renee Matthews DOB: [birth year] MRN: [id number]  Date of admission: 03/03/2017 Delivering MD: Pryor Ochoa [facility]   Date of discharge: 03/06/2017  Admitting diagnosis: 34 WEEKS HEAVY BLEEDING Intrauterine pregnancy: [redacted]w[redacted]d     Secondary diagnosis:  Principal Problem:   ROM (rupture of membranes), premature Active Problems:   Indication for care in labor or delivery   Postpartum care following vaginal delivery  Additional problems: none     Discharge diagnosis: Term Pregnancy Delivered                                                                                                Post partum procedures:none  Augmentation: AROM  Complications: None  Hospital course:  Onset of Labor With Vaginal Delivery     21 y.o. yo [id number] at [redacted]w[redacted]d was admitted in Active Labor on 03/03/2017. Patient had an uncomplicated labor course as follows:  Membrane Rupture Time/Date: 11:00 PM ,03/03/2017   Intrapartum Procedures: Episiotomy: None [1]                                         Lacerations:  1st degree [2]  Patient had a delivery of a Viable infant. 03/04/2017  Information for the patient's newborn:  Julissa, Browning [[id number]]  Delivery Method: Vaginal, Spontaneous Delivery (Filed from Delivery Summary)    Pateint had an uncomplicated postpartum course.  She is ambulating, tolerating a regular diet, passing flatus, and urinating well. Patient is discharged home in stable condition on 03/06/17.   Physical exam  Vitals:   03/04/17 1400 03/04/17 1803 03/05/17 0634 03/06/17 0500  BP: (!) 122/51 (!) 116/57 120/64 120/62  Pulse: 70 91 78 70  Resp: 16 16 18 18   Temp: 99.2 F (37.3 C) 98.3 F (36.8 C) 98.1 F (36.7 C) 98.5 F (36.9 C)  TempSrc: Oral Oral Oral Oral  SpO2:      Weight:      Height:       General: alert and cooperative Lochia: appropriate Uterine Fundus: firm  Labs: Lab Results  Component Value Date   WBC 10.6 (H)  03/05/2017   HGB 9.8 (L) 03/05/2017   HCT 28.4 (L) 03/05/2017   MCV 85.0 03/05/2017   PLT 244 03/05/2017   CMP Latest Ref Rng & Units 02/17/2015  Glucose 65 - 99 mg/dL 93  BUN 6 - 20 mg/dL 9  Creatinine 6.96 - 2.95 mg/dL 2.84  Sodium 132 - 440 mmol/L 140  Potassium 3.5 - 5.1 mmol/L 4.1  Chloride 101 - 111 mmol/L 108  CO2 22 - 32 mmol/L 21(L)  Calcium 8.9 - 10.3 mg/dL 9.5  Total Protein 6.5 - 8.1 g/dL 7.7  Total Bilirubin 0.3 - 1.2 mg/dL 0.4  Alkaline Phos 38 - 126 U/L 48  AST 15 - 41 U/L 17  ALT 14 - 54 U/L 11(L)    Discharge instruction: per After Visit Summary and "Baby and Me Booklet".  After visit meds:  Allergies  as of 03/06/2017      Reactions   Latex Itching, Rash      Medication List    TAKE these medications   acetaminophen 325 MG tablet Commonly known as:  TYLENOL Take 2 tablets (650 mg total) by mouth every 4 (four) hours as needed (for pain scale < 4).   ibuprofen 600 MG tablet Commonly known as:  ADVIL,MOTRIN Take 1 tablet (600 mg total) by mouth every 6 (six) hours.   prenatal multivitamin Tabs tablet Take 1 tablet by mouth daily at 12 noon.       Diet: routine diet  Activity: Advance as tolerated. Pelvic rest for 6 weeks.   Outpatient follow up:6 weeks Follow up Appt:No future appointments. Follow up Visit:No Follow-up on file.  Postpartum contraception: Undecided, considering POP's  Newborn Data: Live born female  Birth Weight: 6 lb 11.2 oz (3040 g) APGAR: 8, 9  Baby Feeding: Breast Disposition:home with mother   03/06/2017 Oliver PilaKathy W Suliman Termini, MD

## 2017-03-07 ENCOUNTER — Ambulatory Visit: Payer: Self-pay

## 2017-03-07 NOTE — Lactation Note (Signed)
This note was copied from a baby's chart. Lactation Consultation Note  Patient Name: Renee Matthews ZOXWR'UToday's Date: 03/07/2017  Pecola LeisureBaby is receiving double phototherapy and will not be discharged today.  Mom puts baby to breast with cues and then post pumps and bottle feeds expressed milk.  Mom pumped 20 mls last pumping.  Instructed to pump using standard setting and increase frequency to every 2 hours.  Discussed the importance of frequent feeds to increase output.  Mom to call with concerns/assist prn.   Maternal Data    Feeding Feeding Type: Breast Fed Length of feed: 35 min  LATCH Score/Interventions                      Lactation Tools Discussed/Used     Consult Status      [name], Alyia Lacerte S 03/07/2017, 11:55 AM

## 2017-03-08 ENCOUNTER — Ambulatory Visit: Payer: Self-pay

## 2017-03-08 NOTE — Lactation Note (Signed)
This note was copied from a baby's chart. Lactation Consultation Note  Patient Name: Renee Matthews: 03/08/2017   Mom requesting 2 week pump rental at discharge, paperwork done and pump rented.   OP appointment made for 7/20 @ 2pm.  Judee ClaraSmith, Rylyn Ranganathan E 03/08/2017, 4:18 PM

## 2017-03-08 NOTE — Lactation Note (Signed)
This note was copied from a baby's chart. Lactation Consultation Note  Patient Name: Renee Matthews ZOXWR'UToday's Date: 03/08/2017 Reason for consult: Follow-up assessment;Hyperbilirubinemia  Visited with Mom, baby 4194 hrs old. Baby gained and is at 5% weight loss today. 6 voids, 1 stool  Bilirubin 11 this am, bili blanket turned off.  Serum bili draw at 1 pm. Baby latched onto breast in cradle hold, with a deep, wide latch.  Recommended keeping baby STS, and using alternate breast compression during feedings to increase milk transfer.   Mom continuing to post feed double pump, Mom obtained 35 ml last pumping.  Mom asked about renting a DEBP.  Talked about rental program.  Mom doesn't have WIC [facility]  Encouraged her to call to sign up, and we could [facility], and follow up Lactation appointment.   Showed Mom and FOB how to assemble pump parts for a manual double pump, and single pump.   Mom to let us know after bilirubin about her need for a pump.   Consult Status Consult Status: Complete Date: 03/08/17 Follow-up type: Call as needed    Judee ClaraSmith, Kambria Grima E 03/08/2017, 9:50 AM

## 2018-06-30 LAB — OB RESULTS CONSOLE ABO/RH: RH Type: POSITIVE

## 2018-06-30 LAB — OB RESULTS CONSOLE GC/CHLAMYDIA
Chlamydia: NEGATIVE
Gonorrhea: NEGATIVE

## 2018-06-30 LAB — OB RESULTS CONSOLE RPR: RPR: NONREACTIVE

## 2018-06-30 LAB — OB RESULTS CONSOLE ANTIBODY SCREEN: Antibody Screen: NEGATIVE

## 2018-06-30 LAB — OB RESULTS CONSOLE HEPATITIS B SURFACE ANTIGEN: Hepatitis B Surface Ag: NEGATIVE

## 2018-06-30 LAB — OB RESULTS CONSOLE RUBELLA ANTIBODY, IGM: Rubella: IMMUNE

## 2018-07-06 ENCOUNTER — Inpatient Hospital Stay (HOSPITAL_COMMUNITY)
Admission: AD | Admit: 2018-07-06 | Discharge: 2018-07-07 | Disposition: A | Discharge status: Home / Self Care | Payer: Medicaid Other | Source: Ambulatory Visit | Attending: Obstetrics and Gynecology | Admitting: Obstetrics and Gynecology

## 2018-07-06 ENCOUNTER — Encounter (HOSPITAL_COMMUNITY): Payer: Self-pay | Admitting: *Deleted

## 2018-07-06 ENCOUNTER — Other Ambulatory Visit: Payer: Self-pay

## 2018-07-06 DIAGNOSIS — Z3A14 14 weeks gestation of pregnancy: Secondary | ICD-10-CM | POA: Diagnosis not present

## 2018-07-06 DIAGNOSIS — O210 Mild hyperemesis gravidarum: Secondary | ICD-10-CM | POA: Insufficient documentation

## 2018-07-06 LAB — URINALYSIS, ROUTINE W REFLEX MICROSCOPIC
Bilirubin Urine: NEGATIVE
GLUCOSE, UA: NEGATIVE mg/dL
Hgb urine dipstick: NEGATIVE
KETONES UR: 80 mg/dL — AB
Leukocytes, UA: NEGATIVE
NITRITE: NEGATIVE
PROTEIN: 30 mg/dL — AB
Specific Gravity, Urine: 1.033 — ABNORMAL HIGH (ref 1.005–1.030)
pH: 5 (ref 5.0–8.0)

## 2018-07-06 MED ORDER — PROMETHAZINE HCL 25 MG/ML IJ SOLN
25.0000 mg | Freq: Once | INTRAVENOUS | Status: AC
  Administered 2018-07-06: 25 mg via INTRAVENOUS
  Filled 2018-07-06: qty 1

## 2018-07-06 MED ORDER — M.V.I. ADULT IV INJ
Freq: Once | INTRAVENOUS | Status: AC
  Administered 2018-07-06: 23:00:00 via INTRAVENOUS
  Filled 2018-07-06: qty 1000

## 2018-07-06 NOTE — MAU Provider Note (Addendum)
History    CSN: [id number] Arrival date and time: 07/06/18 1531 First Provider Initiated Contact with Patient 07/06/18 2039    Chief Complaint  Patient presents with  . Morning Sickness   HPI Ms.  Renee Matthews is a 22 y.o. year old G81P2002 female at [redacted]w[redacted]d weeks gestation who presents to MAU reporting N/V since Sunday 07/03/18. She is unable to keep down any solids or liquids. She has no appetite, because of the N/V. She feels weak and hs mild RLQ & LUQ pain.  Past Medical History:  Diagnosis Date  . Asthma   . ROM (rupture of membranes), premature 03/04/2017    History reviewed. No pertinent surgical history.  Family History  Problem Relation Age of Onset  . Hypertension Maternal Aunt   . Diabetes Maternal Grandmother   . Heart disease Maternal Grandmother   . Cancer Maternal Grandfather     Social History   Tobacco Use  . Smoking status: Never Smoker  . Smokeless tobacco: Never Used  Substance Use Topics  . Alcohol use: No  . Drug use: No    Allergies:  Allergies  Allergen Reactions  . Latex Itching and Rash    Medications Prior to Admission  Medication Sig Dispense Refill Last Dose  . acetaminophen (TYLENOL) 325 MG tablet Take 2 tablets (650 mg total) by mouth every 4 (four) hours as needed (for pain scale < 4). 30 tablet 1   . ibuprofen (ADVIL,MOTRIN) 600 MG tablet Take 1 tablet (600 mg total) by mouth every 6 (six) hours. 30 tablet 0   . Prenatal Vit-Fe Fumarate-FA (PRENATAL MULTIVITAMIN) TABS tablet Take 1 tablet by mouth daily at 12 noon.   03/03/2017 at Unknown time    Review of Systems  Constitutional: Positive for appetite change.  HENT: Negative.   Eyes: Negative.   Respiratory: Negative.   Cardiovascular: Negative.   Gastrointestinal: Positive for abdominal pain, nausea and vomiting.  Endocrine: Negative.   Genitourinary: Negative.   Musculoskeletal: Negative.   Skin: Negative.   Allergic/Immunologic: Negative.   Neurological: Positive for  weakness.  Hematological: Negative.   Psychiatric/Behavioral: Negative.    Physical Exam   Blood pressure 121/69, pulse (!) 112, temperature 98 F (36.7 C), resp. rate 16, weight 75.8 kg, last menstrual period 03/24/2018.  Physical Exam  Nursing note and vitals reviewed. Constitutional: She is oriented to person, place, and time. She appears well-developed and well-nourished.  HENT:  Head: Normocephalic and atraumatic.  Eyes: Pupils are equal, round, and reactive to light. Conjunctivae are normal.  Neck: Normal range of motion.  Cardiovascular: Normal rate, regular rhythm and normal heart sounds.  Respiratory: Effort normal and breath sounds normal.  GI: Soft. Bowel sounds are decreased. There is tenderness in the right lower quadrant and left upper quadrant.  Genitourinary:  Genitourinary Comments: Pelvic deferred  Musculoskeletal: Normal range of motion.  Neurological: She is alert and oriented to person, place, and time.  Skin: Skin is warm and dry.  Psychiatric: She has a normal mood and affect. Her behavior is normal. Judgment and thought content normal.    MAU Course  Procedures  MDM CCUA FHTs by doppler: 172 bpm  IVFs: Phenergan in D5LR at 999 ml/hr; then MVI in LR 1000 ml @ 500 ml/hr  Results for orders placed or performed during the hospital encounter of 07/06/18 (from the past 24 hour(s))  Urinalysis, Routine w reflex microscopic     Status: Abnormal   Collection Time: 07/06/18  5:15 PM  Result Value  Ref Range   Color, Urine AMBER (A) YELLOW   APPearance HAZY (A) CLEAR   Specific Gravity, Urine 1.033 (H) 1.005 - 1.030   pH 5.0 5.0 - 8.0   Glucose, UA NEGATIVE NEGATIVE mg/dL   Hgb urine dipstick NEGATIVE NEGATIVE   Bilirubin Urine NEGATIVE NEGATIVE   Ketones, ur 80 (A) NEGATIVE mg/dL   Protein, ur 30 (A) NEGATIVE mg/dL   Nitrite NEGATIVE NEGATIVE   Leukocytes, UA NEGATIVE NEGATIVE   RBC / HPF 0-5 0 - 5 RBC/hpf   WBC, UA 0-5 0 - 5 WBC/hpf   Bacteria, UA  RARE (A) NONE SEEN   Squamous Epithelial / LPF 11-20 0 - 5   Mucus PRESENT    Non Squamous Epithelial 0-5 (A) NONE SEEN    Report given to and care assumed by Renee BannisterLaurel Latham Kinzler, DO   Renee Moraolitta Dawson 07/06/2018, 8:39 PM Assessment and Plan  I assumed care of this patient at 8:40pm. Renee Matthews- Renee Whyte, DO    22yo [id number] at 7592w6d who presented with nausea and vomiting. Symptoms improved with anti-emetics and IVF boluses. Discharge home with return precautions and encouraged to try bland foods and slow sips of fluids. All questions answered prior to discharge. Renee DeerLaurel S. Earlene PlaterWallace, DO OB/GYN Fellow

## 2018-07-06 NOTE — MAU Note (Signed)
Pt  Presents to MAU with complaints of nausea and vomiting since Sunday.

## 2018-08-13 ENCOUNTER — Encounter (HOSPITAL_COMMUNITY): Payer: Self-pay | Admitting: *Deleted

## 2018-08-13 ENCOUNTER — Inpatient Hospital Stay (HOSPITAL_COMMUNITY)
Admission: AD | Admit: 2018-08-13 | Discharge: 2018-08-14 | Disposition: A | Discharge status: Home / Self Care | Payer: Medicaid Other | Source: Ambulatory Visit | Attending: Obstetrics and Gynecology | Admitting: Obstetrics and Gynecology

## 2018-08-13 DIAGNOSIS — O99512 Diseases of the respiratory system complicating pregnancy, second trimester: Secondary | ICD-10-CM | POA: Insufficient documentation

## 2018-08-13 DIAGNOSIS — J45909 Unspecified asthma, uncomplicated: Secondary | ICD-10-CM | POA: Insufficient documentation

## 2018-08-13 DIAGNOSIS — Z91040 Latex allergy status: Secondary | ICD-10-CM | POA: Diagnosis not present

## 2018-08-13 DIAGNOSIS — R05 Cough: Secondary | ICD-10-CM | POA: Diagnosis present

## 2018-08-13 DIAGNOSIS — O26892 Other specified pregnancy related conditions, second trimester: Secondary | ICD-10-CM | POA: Diagnosis not present

## 2018-08-13 DIAGNOSIS — Z791 Long term (current) use of non-steroidal anti-inflammatories (NSAID): Secondary | ICD-10-CM | POA: Diagnosis not present

## 2018-08-13 DIAGNOSIS — R059 Cough, unspecified: Secondary | ICD-10-CM

## 2018-08-13 DIAGNOSIS — J101 Influenza due to other identified influenza virus with other respiratory manifestations: Secondary | ICD-10-CM | POA: Diagnosis not present

## 2018-08-13 DIAGNOSIS — Z3A20 20 weeks gestation of pregnancy: Secondary | ICD-10-CM | POA: Insufficient documentation

## 2018-08-13 DIAGNOSIS — O219 Vomiting of pregnancy, unspecified: Secondary | ICD-10-CM | POA: Diagnosis not present

## 2018-08-13 DIAGNOSIS — R509 Fever, unspecified: Secondary | ICD-10-CM

## 2018-08-13 HISTORY — DX: Calculus of gallbladder without cholecystitis without obstruction: K80.20

## 2018-08-13 LAB — INFLUENZA PANEL BY PCR (TYPE A & B)
INFLBPCR: NEGATIVE
Influenza A By PCR: POSITIVE — AB

## 2018-08-13 MED ORDER — LACTATED RINGERS IV BOLUS
1000.0000 mL | Freq: Once | INTRAVENOUS | Status: AC
  Administered 2018-08-13: 1000 mL via INTRAVENOUS

## 2018-08-13 MED ORDER — ONDANSETRON 8 MG PO TBDP: 8.0000 mg | ORAL_TABLET | Freq: Three times a day (TID) | ORAL | 0 refills | Status: DC | PRN

## 2018-08-13 MED ORDER — BENZONATATE 100 MG PO CAPS: 100.0000 mg | ORAL_CAPSULE | Freq: Three times a day (TID) | ORAL | 0 refills | Status: DC

## 2018-08-13 MED ORDER — PROMETHAZINE HCL 25 MG/ML IJ SOLN
25.0000 mg | Freq: Once | INTRAMUSCULAR | Status: AC
  Administered 2018-08-13: 25 mg via INTRAVENOUS
  Filled 2018-08-13: qty 1

## 2018-08-13 MED ORDER — IBUPROFEN 600 MG PO TABS
600.0000 mg | ORAL_TABLET | Freq: Once | ORAL | Status: AC
  Administered 2018-08-13: 600 mg via ORAL
  Filled 2018-08-13: qty 1

## 2018-08-13 MED ORDER — PROMETHAZINE-DM 6.25-15 MG/5ML PO SYRP: 5.0000 mL | ORAL_SOLUTION | Freq: Four times a day (QID) | ORAL | 0 refills | Status: DC | PRN

## 2018-08-13 MED ORDER — DEXTROSE 5 % IN LACTATED RINGERS IV BOLUS
1000.0000 mL | Freq: Once | INTRAVENOUS | Status: AC
  Administered 2018-08-13: 1000 mL via INTRAVENOUS

## 2018-08-13 MED ORDER — OSELTAMIVIR PHOSPHATE 75 MG PO CAPS
75.0000 mg | ORAL_CAPSULE | Freq: Once | ORAL | Status: AC
  Administered 2018-08-13: 75 mg via ORAL
  Filled 2018-08-13: qty 1

## 2018-08-13 MED ORDER — SODIUM CHLORIDE 0.9 % IV SOLN
8.0000 mg | Freq: Once | INTRAVENOUS | Status: AC
  Administered 2018-08-13: 8 mg via INTRAVENOUS
  Filled 2018-08-13: qty 4

## 2018-08-13 MED ORDER — FAMOTIDINE IN NACL 20-0.9 MG/50ML-% IV SOLN
20.0000 mg | Freq: Once | INTRAVENOUS | Status: AC
  Administered 2018-08-13: 20 mg via INTRAVENOUS
  Filled 2018-08-13: qty 50

## 2018-08-13 NOTE — MAU Note (Signed)
Pt reports she started having flu like symptoms. Headache nasal congestion cough and N/V started last night. Unsure if she has a fever but has body ache and chills

## 2018-08-13 NOTE — MAU Provider Note (Signed)
History     CSN: [id number]  Arrival date and time: 08/13/18 [id number]   First Provider Initiated Contact with Patient 08/13/18 2013      Chief Complaint  Patient presents with  . Cough  . Sore  . Headache   HPI   Ms.Renee Matthews is a 22 y.o. female 343P2002 @ 705w3d here with cough, sore throat, fever, and headache. She started experiencing body aches today. Last night she started with a cough. Her child was diagnosed with Flu A today and she was prescribed tamiflu as a preventative. She took one dose this morning. Says she has also been vomiting. She has had 8-10 episodes of vomiting today. No diarrhea. + fetal movement.   OB History    Gravida  3   Para  2   Term  2   Preterm      AB      Living  2     SAB      TAB      Ectopic      Multiple  0   Live Births  2           Past Medical History:  Diagnosis Date  . Asthma   . Gallstones   . ROM (rupture of membranes), premature 03/04/2017    Past Surgical History:  Procedure Laterality Date  . NO PAST SURGERIES      Family History  Problem Relation Age of Onset  . Hypertension Maternal Aunt   . Diabetes Maternal Grandmother   . Heart disease Maternal Grandmother   . Cancer Maternal Grandfather     Social History   Tobacco Use  . Smoking status: Never Smoker  . Smokeless tobacco: Never Used  Substance Use Topics  . Alcohol use: No  . Drug use: No    Allergies:  Allergies  Allergen Reactions  . Latex Itching and Rash    Medications Prior to Admission  Medication Sig Dispense Refill Last Dose  . acetaminophen (TYLENOL) 325 MG tablet Take 2 tablets (650 mg total) by mouth every 4 (four) hours as needed (for pain scale < 4). 30 tablet 1   . ibuprofen (ADVIL,MOTRIN) 600 MG tablet Take 1 tablet (600 mg total) by mouth every 6 (six) hours. 30 tablet 0   . Prenatal Vit-Fe Fumarate-FA (PRENATAL MULTIVITAMIN) TABS tablet Take 1 tablet by mouth daily at 12 noon.   03/03/2017 at Unknown time    Results for orders placed or performed during the hospital encounter of 08/13/18 (from the past 48 hour(s))  Influenza panel by PCR (type A & B)     Status: Abnormal   Collection Time: 08/13/18  9:02 PM  Result Value Ref Range   Influenza A By PCR POSITIVE (A) NEGATIVE    Comment: CRITICAL RESULT CALLED TO, READ BACK BY AND VERIFIED WITH: K WILSON 08/13/18 AT 2152 BY H SOEWARDIMAN    Influenza B By PCR NEGATIVE NEGATIVE    Comment: (NOTE) The Xpert Xpress Flu assay is intended as an aid in the diagnosis of  influenza and should not be used as a sole basis for treatment.  This  assay is FDA approved for nasopharyngeal swab specimens only. Nasal  washings and aspirates are unacceptable for Xpert Xpress Flu testing. Performed at [facility], [address] [place], [place] [address]    Review of Systems  Constitutional: Positive for chills and fever.  HENT: Positive for sore throat.   Respiratory: Positive for cough. Negative for shortness of  breath.   Gastrointestinal: Positive for nausea and vomiting.   Physical Exam   Blood pressure (!) 113/48, pulse (!) 109, temperature 98.8 F (37.1 C), resp. rate 18, last menstrual period 03/24/2018, SpO2 97 %, unknown if currently breastfeeding.  Physical Exam  Constitutional: She is oriented to person, place, and time. She appears well-developed.  Non-toxic appearance. She has a sickly appearance. She appears ill. No distress.  HENT:  Head: Normocephalic.  Eyes: Pupils are equal, round, and reactive to light.  Neck: Neck supple.  Cardiovascular: Normal rate.  Respiratory: Breath sounds normal. No respiratory distress. She has no wheezes. She has no rales. She exhibits no tenderness.  Musculoskeletal: Normal range of motion.  Neurological: She is alert and oriented to person, place, and time.  Skin: Skin is warm.  Psychiatric: Her behavior is normal.   MAU Course  Procedures  None  MDM  + fetal heart tones via doppler   Lr bolus X 1 & D5 LR bolus x 1 Zofran 8 mg IV & Phenergan 25 mg & Pepcid Ibuprofen 600 mg given for fever and chills  Tamiflu 75 mg given PO  Patient without vomiting in MAU. Patient tolerating oral fluids Patient feeling significantly better; feels ready to go home.  Influenza positive for Flu A; patient informed.    Assessment and Plan   A:  1. Influenza A   2. Cough   3. Fever and chills   4. Nausea and vomiting during pregnancy     P:  Discharge home with strict return precautions Continue Tamiflu until gone Rx: Phenergan cough, Zofran, Tessalon Perles Return to MAU if symptoms worsen Increase oral fluids REST   Artin Mceuen, Victorino DikeJennifer I, NP 08/14/2018 1:30 AM

## 2018-08-13 NOTE — Discharge Instructions (Signed)
Cool Mist Vaporizer A cool mist vaporizer is a device that releases a cool mist into the air. If you have a cough or a cold, using a vaporizer may help relieve your symptoms. The mist adds moisture to the air, which may help thin your mucus and make it less sticky. When your mucus is thin and less sticky, it easier for you to breathe and to cough up secretions. Do not use a vaporizer if you are allergic to mold. Follow these instructions at home:  Follow the instructions that come with the vaporizer.  Do not use anything other than distilled water in the vaporizer.  Do not run the vaporizer all of the time. Doing that can cause mold or bacteria to grow in the vaporizer.  Clean the vaporizer after each time that you use it.  Clean and dry the vaporizer well before storing it.  Stop using the vaporizer if your breathing symptoms get worse. This information is not intended to replace advice given to you by your health care provider. Make sure you discuss any questions you have with your health care provider. Document Released: 05/07/2004 Document Revised: 02/28/2016 Document Reviewed: 11/09/2015 Elsevier Interactive Patient Education  2019 Elsevier Inc.  Influenza, Adult Influenza is also called "the flu." It is an infection in the lungs, nose, and throat (respiratory tract). It is caused by a virus. The flu causes symptoms that are similar to symptoms of a cold. It also causes a high fever and body aches. The flu spreads easily from person to person (is contagious). Getting a flu shot (influenza vaccination) every year is the best way to prevent the flu. What are the causes? This condition is caused by the influenza virus. You can get the virus by:  Breathing in droplets that are in the air from the cough or sneeze of a person who has the virus.  Touching something that has the virus on it (is contaminated) and then touching your mouth, nose, or eyes. What increases the risk? Certain  things may make you more likely to get the flu. These include:  Not washing your hands often.  Having close contact with many people during cold and flu season.  Touching your mouth, eyes, or nose without first washing your hands.  Not getting a flu shot every year. You may have a higher risk for the flu, along with serious problems such as a lung infection (pneumonia), if you:  Are older than 65.  Are pregnant.  Have a weakened disease-fighting system (immune system) because of a disease or taking certain medicines.  Have a long-term (chronic) illness, such as: ? Heart, kidney, or lung disease. ? Diabetes. ? Asthma.  Have a liver disorder.  Are very overweight (morbidly obese).  Have anemia. This is a condition that affects your red blood cells. What are the signs or symptoms? Symptoms usually begin suddenly and last 4-14 days. They may include:  Fever and chills.  Headaches, body aches, or muscle aches.  Sore throat.  Cough.  Runny or stuffy (congested) nose.  Chest discomfort.  Not wanting to eat as much as normal (poor appetite).  Weakness or feeling tired (fatigue).  Dizziness.  Feeling sick to your stomach (nauseous) or throwing up (vomiting). How is this treated? If the flu is found early, you can be treated with medicine that can help reduce how bad the illness is and how long it lasts (antiviral medicine). This may be given by mouth (orally) or through an IV tube. Taking   care of yourself at home can help your symptoms get better. Your doctor may suggest:  Taking over-the-counter medicines.  Drinking plenty of fluids. The flu often goes away on its own. If you have very bad symptoms or other problems, you may be treated in a hospital. Follow these instructions at home:     Activity  Rest as needed. Get plenty of sleep.  Stay home from work or school as told by your doctor. ? Do not leave home until you do not have a fever for 24 hours without  taking medicine. ? Leave home only to visit your doctor. Eating and drinking  Take an ORS (oral rehydration solution). This is a drink that is sold at pharmacies and stores.  Drink enough fluid to keep your pee (urine) pale yellow.  Drink clear fluids in small amounts as you are able. Clear fluids include: ? Water. ? Ice chips. ? Fruit juice that has water added (diluted fruit juice). ? Low-calorie sports drinks.  Eat bland, easy-to-digest foods in small amounts as you are able. These foods include: ? Bananas. ? Applesauce. ? Rice. ? Lean meats. ? Toast. ? Crackers.  Do not eat or drink: ? Fluids that have a lot of sugar or caffeine. ? Alcohol. ? Spicy or fatty foods. General instructions  Take over-the-counter and prescription medicines only as told by your doctor.  Use a cool mist humidifier to add moisture to the air in your home. This can make it easier for you to breathe.  Cover your mouth and nose when you cough or sneeze.  Wash your hands with soap and water often, especially after you cough or sneeze. If you cannot use soap and water, use alcohol-based hand sanitizer.  Keep all follow-up visits as told by your doctor. This is important. How is this prevented?   Get a flu shot every year. You may get the flu shot in late summer, fall, or winter. Ask your doctor when you should get your flu shot.  Avoid contact with people who are sick during fall and winter (cold and flu season). Contact a doctor if:  You get new symptoms.  You have: ? Chest pain. ? Watery poop (diarrhea). ? A fever.  Your cough gets worse.  You start to have more mucus.  You feel sick to your stomach.  You throw up. Get help right away if you:  Have shortness of breath.  Have trouble breathing.  Have skin or nails that turn a bluish color.  Have very bad pain or stiffness in your neck.  Get a sudden headache.  Get sudden pain in your face or ear.  Cannot eat or drink  without throwing up. Summary  Influenza ("the flu") is an infection in the lungs, nose, and throat. It is caused by a virus.  Take over-the-counter and prescription medicines only as told by your doctor.  Getting a flu shot every year is the best way to avoid getting the flu. This information is not intended to replace advice given to you by your health care provider. Make sure you discuss any questions you have with your health care provider. Document Released: 05/19/2008 Document Revised: 01/26/2018 Document Reviewed: 01/26/2018 Elsevier Interactive Patient Education  2019 Elsevier Inc.  

## 2018-08-24 NOTE — L&D Delivery Note (Signed)
Delivery Note Pt pushed well for almost an hour.  Will pushing there were early/variable decels and increased bleeding.  With delivery 300-400cc of dark clot delivered with baby.  At 2:08 AM a viable and healthy female was delivered via Vaginal, Spontaneous (Presentation: ROP; ROT ).  APGAR: 8, 9; weight  P.   Placenta status: delivered, intact with more clot.  Cord: 3V with the following complications: none.    Anesthesia:  epidural Episiotomy: None Lacerations: None Suture Repair: N/A Est. Blood Loss (mL): 768cc  Mom to postpartum.  Baby to Couplet care / Skin to Skin.  Keilly Fatula Bovard-Stuckert 12/22/2018, 2:27 AM  Pt desires circumcision for female infant, desires in office, has paid.  Br/B+/RI/Tdap in PNC/ ?OCP

## 2018-10-04 LAB — OB RESULTS CONSOLE HIV ANTIBODY (ROUTINE TESTING): HIV: NONREACTIVE

## 2018-10-11 ENCOUNTER — Encounter (HOSPITAL_COMMUNITY): Payer: Self-pay

## 2018-10-11 ENCOUNTER — Inpatient Hospital Stay (HOSPITAL_COMMUNITY)
Admission: AD | Admit: 2018-10-11 | Discharge: 2018-10-12 | Disposition: A | Discharge status: Home / Self Care | Payer: Medicaid Other | Source: Ambulatory Visit | Attending: Obstetrics and Gynecology | Admitting: Obstetrics and Gynecology

## 2018-10-11 ENCOUNTER — Other Ambulatory Visit: Payer: Self-pay

## 2018-10-11 DIAGNOSIS — O368130 Decreased fetal movements, third trimester, not applicable or unspecified: Secondary | ICD-10-CM | POA: Insufficient documentation

## 2018-10-11 DIAGNOSIS — Z3A28 28 weeks gestation of pregnancy: Secondary | ICD-10-CM | POA: Insufficient documentation

## 2018-10-11 NOTE — MAU Provider Note (Signed)
Chief Complaint:  Decreased Fetal Movement   First Provider Initiated Contact with Patient 10/11/18 2352      HPI: Renee Matthews is a 23 y.o. G3P2002 at 96w5dwho presents to maternity admissions reporting decreased fetal movement tonight for 2 hours.  Moving well now. . She reports good fetal movement now, denies LOF, vaginal bleeding, vaginal itching/burning, urinary symptoms, h/a, dizziness, n/v, diarrhea, constipation or fever/chills.  She denies headache, visual changes or RUQ abdominal pain.  RN Note: Pt states that she is having decreased fetal movement. Pt states the last time she felt him was 2 hours ago. She states that she did feel the baby move a little bit while using the bathroom in MAU around 2340.  Denies vaginal bleeding or LOF.   Past Medical History: Past Medical History:  Diagnosis Date  . Asthma   . Gallstones   . ROM (rupture of membranes), premature 03/04/2017    Past obstetric history: OB History  Gravida Para Term Preterm AB Living  3 2 2     2   SAB TAB Ectopic Multiple Live Births        0 2    # Outcome Date GA Lbr Len/2nd Weight Sex Delivery Anes PTL Lv  3 Current           2 Term 03/04/17 [redacted]w[redacted]d 11:28 / 01:27 3040 g M Vag-Spont EPI  LIV  1 Term 2012     Vag-Spont   LIV    Past Surgical History: Past Surgical History:  Procedure Laterality Date  . NO PAST SURGERIES      Family History: Family History  Problem Relation Age of Onset  . Hypertension Maternal Aunt   . Diabetes Maternal Grandmother   . Heart disease Maternal Grandmother   . Cancer Maternal Grandfather     Social History: Social History   Tobacco Use  . Smoking status: Never Smoker  . Smokeless tobacco: Never Used  Substance Use Topics  . Alcohol use: No  . Drug use: No    Allergies:  Allergies  Allergen Reactions  . Latex Itching and Rash    Meds:  Medications Prior to Admission  Medication Sig Dispense Refill Last Dose  . acetaminophen (TYLENOL) 325 MG tablet  Take 2 tablets (650 mg total) by mouth every 4 (four) hours as needed (for pain scale < 4). 30 tablet 1 Past Month at Unknown time  . ondansetron (ZOFRAN ODT) 8 MG disintegrating tablet Take 1 tablet (8 mg total) by mouth every 8 (eight) hours as needed for nausea or vomiting. 20 tablet 0 Past Week at Unknown time  . Prenatal Vit-Fe Fumarate-FA (PRENATAL MULTIVITAMIN) TABS tablet Take 1 tablet by mouth daily at 12 noon.   10/11/2018 at Unknown time  . benzonatate (TESSALON) 100 MG capsule Take 1 capsule (100 mg total) by mouth every 8 (eight) hours. 21 capsule 0   . ibuprofen (ADVIL,MOTRIN) 600 MG tablet Take 1 tablet (600 mg total) by mouth every 6 (six) hours. 30 tablet 0   . promethazine-dextromethorphan (PROMETHAZINE-DM) 6.25-15 MG/5ML syrup Take 5 mLs by mouth 4 (four) times daily as needed for cough. 118 mL 0     I have reviewed patient's Past Medical Hx, Surgical Hx, Family Hx, Social Hx, medications and allergies.   ROS:  Review of Systems  Constitutional: Negative for chills, fatigue and fever.  Respiratory: Negative for shortness of breath.   Gastrointestinal: Negative for abdominal pain.  Genitourinary: Negative for vaginal bleeding.  Neurological: Negative for dizziness.  Other systems negative  Physical Exam   Patient Vitals for the past 24 hrs:  BP Temp Pulse Resp SpO2 Weight  10/11/18 2345 122/66 98.7 F (37.1 C) 93 20 99 % -  10/11/18 2336 - - - - - 84.8 kg   Constitutional: Well-developed, well-nourished female in no acute distress.  Cardiovascular: normal rate and rhythm Respiratory: normal effort, clear to auscultation bilaterally GI: Abd soft, non-tender, gravid appropriate for gestational age.   No rebound or guarding. MS: Extremities nontender, no edema, normal ROM Neurologic: Alert and oriented x 4.  GU: Neg CVAT.  PELVIC EXAM:  deferred  FHT:  Baseline 140 , moderate variability, accelerations present, no decelerations  Good variability for gestational  age. Contractions: Rare   Labs: Results for orders placed or performed during the hospital encounter of 10/11/18 (from the past 24 hour(s))  Urinalysis, Routine w reflex microscopic     Status: None   Collection Time: 10/11/18 11:41 PM  Result Value Ref Range   Color, Urine YELLOW YELLOW   APPearance CLEAR CLEAR   Specific Gravity, Urine 1.015 1.005 - 1.030   pH 6.5 5.0 - 8.0   Glucose, UA NEGATIVE NEGATIVE mg/dL   Hgb urine dipstick NEGATIVE NEGATIVE   Bilirubin Urine NEGATIVE NEGATIVE   Ketones, ur NEGATIVE NEGATIVE mg/dL   Protein, ur NEGATIVE NEGATIVE mg/dL   Nitrite NEGATIVE NEGATIVE   Leukocytes,Ua NEGATIVE NEGATIVE    Imaging:  No results found.  MAU Course/MDM: I have ordered labs and reviewed results. UA normal NST reviewed and is reassuring.  Extending monitoriing for reassurance  Treatments in MAU included EFM.    Assessment: Single intrauterine pregnacy at 100w6d Decreased fetal movement, now moving well Reassuring fetal heart rate tracing  Plan: Discharge home Preterm Labor precautions and fetal kick counts Follow up in Office for prenatal visits and recheck of status  Encouraged to return here or to other Urgent Care/ED if she develops worsening of symptoms, increase in pain, fever, or other concerning symptoms.   Pt stable at time of discharge.  Wynelle Bourgeois CNM, MSN Certified Nurse-Midwife 10/11/2018 11:52 PM

## 2018-10-11 NOTE — MAU Note (Signed)
Pt states that she is having decreased fetal movement. Pt states the last time she felt him was 2 hours ago. She states that she did feel the baby move a little bit while using the bathroom in MAU around 2340.  Denies vaginal bleeding or LOF.

## 2018-10-12 DIAGNOSIS — O368130 Decreased fetal movements, third trimester, not applicable or unspecified: Secondary | ICD-10-CM

## 2018-10-12 DIAGNOSIS — Z3A28 28 weeks gestation of pregnancy: Secondary | ICD-10-CM

## 2018-10-12 LAB — URINALYSIS, ROUTINE W REFLEX MICROSCOPIC
BILIRUBIN URINE: NEGATIVE
GLUCOSE, UA: NEGATIVE mg/dL
Hgb urine dipstick: NEGATIVE
KETONES UR: NEGATIVE mg/dL
LEUKOCYTE UA: NEGATIVE
Nitrite: NEGATIVE
PH: 6.5 (ref 5.0–8.0)
Protein, ur: NEGATIVE mg/dL
Specific Gravity, Urine: 1.015 (ref 1.005–1.030)

## 2018-10-12 NOTE — Discharge Instructions (Signed)

## 2018-11-30 LAB — OB RESULTS CONSOLE GBS: GBS: NEGATIVE

## 2018-12-13 ENCOUNTER — Other Ambulatory Visit: Payer: Self-pay

## 2018-12-13 ENCOUNTER — Encounter (HOSPITAL_COMMUNITY): Payer: Self-pay | Admitting: *Deleted

## 2018-12-13 ENCOUNTER — Inpatient Hospital Stay (HOSPITAL_COMMUNITY)
Admission: AD | Admit: 2018-12-13 | Discharge: 2018-12-13 | Disposition: A | Discharge status: Home / Self Care | Payer: Medicaid Other | Attending: Obstetrics and Gynecology | Admitting: Obstetrics and Gynecology

## 2018-12-13 DIAGNOSIS — O26893 Other specified pregnancy related conditions, third trimester: Secondary | ICD-10-CM

## 2018-12-13 DIAGNOSIS — N898 Other specified noninflammatory disorders of vagina: Secondary | ICD-10-CM

## 2018-12-13 DIAGNOSIS — O9989 Other specified diseases and conditions complicating pregnancy, childbirth and the puerperium: Secondary | ICD-10-CM | POA: Insufficient documentation

## 2018-12-13 DIAGNOSIS — Z3A37 37 weeks gestation of pregnancy: Secondary | ICD-10-CM | POA: Diagnosis not present

## 2018-12-13 LAB — POCT FERN TEST: POCT Fern Test: NEGATIVE

## 2018-12-13 NOTE — MAU Note (Signed)
PT SAYS SHE HAS SHARP VAG PAINS- STARTED  YESTERDAY.  THINKS  SROM- HAD FIRST GUSH AT 5PM.   LAST SEX- LAST WEEK.  PNC WITH  DR RICHARDSON-  VE  2 CM.

## 2018-12-13 NOTE — MAU Note (Signed)
?   Leaking, first noted night before last.  Small gushes, then happened again yesterday.  Little trickles of clear fluid today.  Having shooting pains in vagina area. Was 2 cm when last went.

## 2018-12-13 NOTE — Discharge Instructions (Signed)
Fetal Movement Counts Patient Name: ________________________________________________ Patient Due Date: ____________________ What is a fetal movement count?  A fetal movement count is the number of times that you feel your baby move during a certain amount of time. This may also be called a fetal kick count. A fetal movement count is recommended for every pregnant woman. You may be asked to start counting fetal movements as early as week 28 of your pregnancy. Pay attention to when your baby is most active. You may notice your baby's sleep and wake cycles. You may also notice things that make your baby move more. You should do a fetal movement count:  When your baby is normally most active.  At the same time each day. A good time to count movements is while you are resting, after having something to eat and drink. How do I count fetal movements? 1. Find a quiet, comfortable area. Sit, or lie down on your side. 2. Write down the date, the start time and stop time, and the number of movements that you felt between those two times. Take this information with you to your health care visits. 3. For 2 hours, count kicks, flutters, swishes, rolls, and jabs. You should feel at least 10 movements during 2 hours. 4. You may stop counting after you have felt 10 movements. 5. If you do not feel 10 movements in 2 hours, have something to eat and drink. Then, keep resting and counting for 1 hour. If you feel at least 4 movements during that hour, you may stop counting. Contact a health care provider if:  You feel fewer than 4 movements in 2 hours.  Your baby is not moving like he or she usually does. Date: ____________ Start time: ____________ Stop time: ____________ Movements: ____________ Date: ____________ Start time: ____________ Stop time: ____________ Movements: ____________ Date: ____________ Start time: ____________ Stop time: ____________ Movements: ____________ Date: ____________ Start time:  ____________ Stop time: ____________ Movements: ____________ Date: ____________ Start time: ____________ Stop time: ____________ Movements: ____________ Date: ____________ Start time: ____________ Stop time: ____________ Movements: ____________ Date: ____________ Start time: ____________ Stop time: ____________ Movements: ____________ Date: ____________ Start time: ____________ Stop time: ____________ Movements: ____________ Date: ____________ Start time: ____________ Stop time: ____________ Movements: ____________ This information is not intended to replace advice given to you by your health care provider. Make sure you discuss any questions you have with your health care provider. Document Released: 09/09/2006 Document Revised: 04/08/2016 Document Reviewed: 09/19/2015 Elsevier Interactive Patient Education  2019 [employer] Braxton Hicks Contractions Contractions of the uterus can occur throughout pregnancy, but they are not always a sign that you are in labor. You may have practice contractions called Braxton Hicks contractions. These false labor contractions are sometimes confused with true labor. What are Braxton Hicks contractions? Braxton Hicks contractions are tightening movements that occur in the muscles of the uterus before labor. Unlike true labor contractions, these contractions do not result in opening (dilation) and thinning of the cervix. Toward the end of pregnancy (32-34 weeks), Braxton Hicks contractions can happen more often and may become stronger. These contractions are sometimes difficult to tell apart from true labor because they can be very uncomfortable. You should not feel embarrassed if you go to the hospital with false labor. Sometimes, the only way to tell if you are in true labor is for your health care provider to look for changes in the cervix. The health care provider will do a physical exam and may monitor your contractions. If   you are not in true labor, the exam  should show that your cervix is not dilating and your water has not broken. If there are no other health problems associated with your pregnancy, it is completely safe for you to be sent home with false labor. You may continue to have Braxton Hicks contractions until you go into true labor. How to tell the difference between true labor and false labor True labor  Contractions last 30-70 seconds.  Contractions become very regular.  Discomfort is usually felt in the top of the uterus, and it spreads to the lower abdomen and low back.  Contractions do not go away with walking.  Contractions usually become more intense and increase in frequency.  The cervix dilates and gets thinner. False labor  Contractions are usually shorter and not as strong as true labor contractions.  Contractions are usually irregular.  Contractions are often felt in the front of the lower abdomen and in the groin.  Contractions may go away when you walk around or change positions while lying down.  Contractions get weaker and are shorter-lasting as time goes on.  The cervix usually does not dilate or become thin. Follow these instructions at home:   Take over-the-counter and prescription medicines only as told by your health care provider.  Keep up with your usual exercises and follow other instructions from your health care provider.  Eat and drink lightly if you think you are going into labor.  If Braxton Hicks contractions are making you uncomfortable: ? Change your position from lying down or resting to walking, or change from walking to resting. ? Sit and rest in a tub of warm water. ? Drink enough fluid to keep your urine pale yellow. Dehydration may cause these contractions. ? Do slow and deep breathing several times an hour.  Keep all follow-up prenatal visits as told by your health care provider. This is important. Contact a health care provider if:  You have a fever.  You have continuous  pain in your abdomen. Get help right away if:  Your contractions become stronger, more regular, and closer together.  You have fluid leaking or gushing from your vagina.  You pass blood-tinged mucus (bloody show).  You have bleeding from your vagina.  You have low back pain that you never had before.  You feel your baby's head pushing down and causing pelvic pressure.  Your baby is not moving inside you as much as it used to. Summary  Contractions that occur before labor are called Braxton Hicks contractions, false labor, or practice contractions.  Braxton Hicks contractions are usually shorter, weaker, farther apart, and less regular than true labor contractions. True labor contractions usually become progressively stronger and regular, and they become more frequent.  Manage discomfort from Braxton Hicks contractions by changing position, resting in a warm bath, drinking plenty of water, or practicing deep breathing. This information is not intended to replace advice given to you by your health care provider. Make sure you discuss any questions you have with your health care provider. Document Released: 12/24/2016 Document Revised: 05/25/2017 Document Reviewed: 12/24/2016 Elsevier Interactive Patient Education  2019 Elsevier Inc.  

## 2018-12-13 NOTE — MAU Provider Note (Signed)
S: Renee Matthews is a 23 y.o. [id number] at [redacted]w[redacted]d  who presents to MAU today complaining of leaking of fluid since yesterday. She denies vaginal bleeding. She denies contractions. She reports normal fetal movement.    O: BP 130/74 (BP Location: Right Arm)   Pulse (!) 101   Temp 98.3 F (36.8 C) (Oral)   Resp 17   Ht 5\' 2"  (1.575 m)   Wt 87.5 kg   LMP 03/24/2018   SpO2 98%   BMI 35.30 kg/m  GENERAL: Well-developed, well-nourished female in no acute distress.  HEAD: Normocephalic, atraumatic.  CHEST: Normal effort of breathing, regular heart rate ABDOMEN: Soft, nontender, gravid PELVIC: Normal external female genitalia. Vagina is pink and rugated. Cervix with normal contour, no lesions. Normal white/mucous discharge.  Negative pooling.   Cervical exam:   deferred    Fetal Monitoring: Baseline: 140 bpm Variability: moderate Accelerations: 15 x 15 Decelerations: none Contractions: none  Results for orders placed or performed during the hospital encounter of 12/13/18 (from the past 24 hour(s))  POCT fern test     Status: Normal   Collection Time: 12/13/18  7:40 PM  Result Value Ref Range   POCT Fern Test Negative = intact amniotic membranes      A: [facility] at [redacted]w[redacted]d  Membranes intact  P:. Discharge home Labor precautions discussed Patient advised to follow-up with [facility] as scheduled or sooner PRN Patient may return to MAU as needed or if her condition were to change or worsen   Kathlene Cote 12/13/2018 7:41 PM

## 2018-12-19 ENCOUNTER — Telehealth (HOSPITAL_COMMUNITY): Payer: Self-pay | Admitting: *Deleted

## 2018-12-19 ENCOUNTER — Encounter (HOSPITAL_COMMUNITY): Payer: Self-pay | Admitting: *Deleted

## 2018-12-21 ENCOUNTER — Inpatient Hospital Stay (HOSPITAL_COMMUNITY): Payer: Medicaid Other | Admitting: Anesthesiology

## 2018-12-21 ENCOUNTER — Encounter (HOSPITAL_COMMUNITY): Payer: Self-pay

## 2018-12-21 ENCOUNTER — Inpatient Hospital Stay (HOSPITAL_COMMUNITY)
Admission: AD | Admit: 2018-12-21 | Discharge: 2018-12-24 | DRG: 807 | Disposition: A | Discharge status: Home / Self Care | Payer: Medicaid Other | Attending: Obstetrics and Gynecology | Admitting: Obstetrics and Gynecology

## 2018-12-21 ENCOUNTER — Other Ambulatory Visit: Payer: Self-pay | Admitting: Obstetrics and Gynecology

## 2018-12-21 ENCOUNTER — Other Ambulatory Visit: Payer: Self-pay

## 2018-12-21 ENCOUNTER — Other Ambulatory Visit (HOSPITAL_COMMUNITY): Payer: Self-pay | Admitting: *Deleted

## 2018-12-21 DIAGNOSIS — Z3490 Encounter for supervision of normal pregnancy, unspecified, unspecified trimester: Secondary | ICD-10-CM

## 2018-12-21 DIAGNOSIS — Z3A39 39 weeks gestation of pregnancy: Secondary | ICD-10-CM

## 2018-12-21 DIAGNOSIS — O26893 Other specified pregnancy related conditions, third trimester: Secondary | ICD-10-CM | POA: Diagnosis present

## 2018-12-21 LAB — TYPE AND SCREEN
ABO/RH(D): B POS
Antibody Screen: NEGATIVE

## 2018-12-21 LAB — COMPREHENSIVE METABOLIC PANEL
ALT: 10 U/L (ref 0–44)
AST: 15 U/L (ref 15–41)
Albumin: 2.8 g/dL — ABNORMAL LOW (ref 3.5–5.0)
Alkaline Phosphatase: 146 U/L — ABNORMAL HIGH (ref 38–126)
Anion gap: 12 (ref 5–15)
BUN: 5 mg/dL — ABNORMAL LOW (ref 6–20)
CO2: 20 mmol/L — ABNORMAL LOW (ref 22–32)
Calcium: 9 mg/dL (ref 8.9–10.3)
Chloride: 105 mmol/L (ref 98–111)
Creatinine, Ser: 0.56 mg/dL (ref 0.44–1.00)
GFR calc Af Amer: >60 mL/min (ref >60)
GFR calc non Af Amer: >60 mL/min (ref >60)
Glucose, Bld: 116 mg/dL — ABNORMAL HIGH (ref 70–99)
Potassium: 3.3 mmol/L — ABNORMAL LOW (ref 3.5–5.1)
Sodium: 137 mmol/L (ref 135–145)
Total Bilirubin: 0.4 mg/dL (ref 0.3–1.2)
Total Protein: 6.5 g/dL (ref 6.5–8.1)

## 2018-12-21 LAB — CBC
HCT: 31.3 % — ABNORMAL LOW (ref 36.0–46.0)
Hemoglobin: 10.3 g/dL — ABNORMAL LOW (ref 12.0–15.0)
MCH: 27 pg (ref 26.0–34.0)
MCHC: 32.9 g/dL (ref 30.0–36.0)
MCV: 81.9 fL (ref 80.0–100.0)
Platelets: 292 10*3/uL (ref 150–400)
RBC: 3.82 MIL/uL — ABNORMAL LOW (ref 3.87–5.11)
RDW: 14 % (ref 11.5–15.5)
WBC: 16.7 10*3/uL — ABNORMAL HIGH (ref 4.0–10.5)
nRBC: 0 % (ref 0.0–0.2)

## 2018-12-21 MED ORDER — SODIUM CHLORIDE (PF) 0.9 % IJ SOLN
INTRAMUSCULAR | Status: DC | PRN
  Administered 2018-12-21: 12 mL/h via EPIDURAL

## 2018-12-21 MED ORDER — DIPHENHYDRAMINE HCL 50 MG/ML IJ SOLN: 12.5000 mg | INTRAMUSCULAR | Status: DC | PRN

## 2018-12-21 MED ORDER — EPHEDRINE 5 MG/ML INJ
10.0000 mg | INTRAVENOUS | Status: DC | PRN
  Filled 2018-12-21: qty 2

## 2018-12-21 MED ORDER — LIDOCAINE HCL (PF) 1 % IJ SOLN: 30.0000 mL | INTRAMUSCULAR | Status: DC | PRN

## 2018-12-21 MED ORDER — SOD CITRATE-CITRIC ACID 500-334 MG/5ML PO SOLN: 30.0000 mL | ORAL | Status: DC | PRN

## 2018-12-21 MED ORDER — LACTATED RINGERS IV SOLN: 500.0000 mL | Freq: Once | INTRAVENOUS | Status: DC

## 2018-12-21 MED ORDER — LACTATED RINGERS IV SOLN
500.0000 mL | Freq: Once | INTRAVENOUS | Status: AC
  Administered 2018-12-21: 500 mL via INTRAVENOUS

## 2018-12-21 MED ORDER — LIDOCAINE HCL (PF) 1 % IJ SOLN
INTRAMUSCULAR | Status: DC | PRN
  Administered 2018-12-21 (×2): 5 mL via EPIDURAL

## 2018-12-21 MED ORDER — FLEET ENEMA 7-19 GM/118ML RE ENEM: 1.0000 | ENEMA | RECTAL | Status: DC | PRN

## 2018-12-21 MED ORDER — OXYCODONE-ACETAMINOPHEN 5-325 MG PO TABS: 1.0000 | ORAL_TABLET | ORAL | Status: DC | PRN

## 2018-12-21 MED ORDER — FENTANYL-BUPIVACAINE-NACL 0.5-0.125-0.9 MG/250ML-% EP SOLN
12.0000 mL/h | EPIDURAL | Status: DC | PRN
  Filled 2018-12-21 (×2): qty 250

## 2018-12-21 MED ORDER — PHENYLEPHRINE 40 MCG/ML (10ML) SYRINGE FOR IV PUSH (FOR BLOOD PRESSURE SUPPORT)
80.0000 ug | PREFILLED_SYRINGE | INTRAVENOUS | Status: DC | PRN
  Filled 2018-12-21: qty 10

## 2018-12-21 MED ORDER — PHENYLEPHRINE 40 MCG/ML (10ML) SYRINGE FOR IV PUSH (FOR BLOOD PRESSURE SUPPORT)
80.0000 ug | PREFILLED_SYRINGE | INTRAVENOUS | Status: DC | PRN
  Filled 2018-12-21 (×2): qty 10

## 2018-12-21 MED ORDER — ONDANSETRON HCL 4 MG/2ML IJ SOLN: 4.0000 mg | Freq: Four times a day (QID) | INTRAMUSCULAR | Status: DC | PRN

## 2018-12-21 MED ORDER — OXYTOCIN BOLUS FROM INFUSION
500.0000 mL | Freq: Once | INTRAVENOUS | Status: AC
  Administered 2018-12-22: 500 mL via INTRAVENOUS

## 2018-12-21 MED ORDER — OXYCODONE-ACETAMINOPHEN 5-325 MG PO TABS: 2.0000 | ORAL_TABLET | ORAL | Status: DC | PRN

## 2018-12-21 MED ORDER — LACTATED RINGERS IV SOLN: 500.0000 mL | INTRAVENOUS | Status: DC | PRN

## 2018-12-21 MED ORDER — LACTATED RINGERS IV SOLN
INTRAVENOUS | Status: DC
  Administered 2018-12-21 (×2): via INTRAVENOUS

## 2018-12-21 MED ORDER — OXYTOCIN 40 UNITS IN NORMAL SALINE INFUSION - SIMPLE MED
2.5000 [IU]/h | INTRAVENOUS | Status: DC
  Filled 2018-12-21: qty 1000

## 2018-12-21 MED ORDER — ACETAMINOPHEN 325 MG PO TABS: 650.0000 mg | ORAL_TABLET | ORAL | Status: DC | PRN

## 2018-12-21 MED ORDER — BUTORPHANOL TARTRATE 1 MG/ML IJ SOLN: 1.0000 mg | INTRAMUSCULAR | Status: DC | PRN

## 2018-12-21 NOTE — H&P (Signed)
Renee Matthews is a 23 y.o. female  G3P2002 at  51 0/7 weeks (EDD 12/29/18 by 11 week [place] as LMP unsure) presenting for IOL at term. Prenatal care complicated by anxiety/depression stable on zoloft.  Also, pt with gallstones that are non-obstructing but can cause pain intermittently.  OB History    Gravida  3   Para  2   Term  2   Preterm      AB      Living  2     SAB      TAB      Ectopic      Multiple  0   Live Births  2         NSVD x 2 at term  6#10oz and 6#110z  Past Medical History:  Diagnosis Date  . Anxiety   . Asthma   . Gallstones   . ROM (rupture of membranes), premature 03/04/2017   Past Surgical History:  Procedure Laterality Date  . NO PAST SURGERIES    . WISDOM TOOTH EXTRACTION     Family History: family history includes Cancer in her maternal grandfather; Diabetes in her maternal grandmother; Heart disease in her maternal grandmother; Hypertension in her maternal aunt. Social History:  reports that she has never smoked. She has never used smokeless tobacco. She reports that she does not drink alcohol or use drugs.     Maternal Diabetes: No Genetic Screening: not performed Maternal Ultrasounds/Referrals: Normal Fetal Ultrasounds or other Referrals:  None Maternal Substance Abuse:  No Significant Maternal Medications:  Meds include: Zoloft Significant Maternal Lab Results:  None Other Comments:  None  Review of Systems  Constitutional: Negative for fever.  Gastrointestinal: Negative for abdominal pain and nausea.   Maternal Medical History:  Reason for admission: Nausea.  Contractions: Frequency: irregular.   Perceived severity is mild.    Fetal activity: Perceived fetal activity is normal.    Prenatal complications: no prenatal complications Prenatal Complications - Diabetes: none.      Last menstrual period 03/24/2018, unknown if currently breastfeeding. Maternal Exam:  Uterine Assessment: Contraction strength is mild.   Contraction frequency is irregular.   Abdomen: Patient reports no abdominal tenderness. Fetal presentation: vertex  Introitus: Normal vulva. Normal vagina.  Pelvis: adequate for delivery.      Physical Exam  Constitutional: She appears well-developed.  Cardiovascular: Normal rate and regular rhythm.  Respiratory: Effort normal.  GI: Soft.  Genitourinary:    Vulva and vagina normal.   Neurological: She is alert.  Psychiatric: She has a normal mood and affect.    Prenatal labs: ABO, Rh: B/Positive/-- (11/07 0000) Antibody: Negative (11/07 0000) Rubella: Immune (11/07 0000) RPR: Nonreactive (11/07 0000)  HBsAg: Negative (11/07 0000)  HIV: Non-reactive (02/11 0000)  GBS: Negative (04/08 0000)  One hour GCT 13 Essential panel negative last pregnancy  Assessment/Plan: Pt for IOL at term with favorable cervix.  Plan pitocin and AROM, with epidural as desired.  Oliver Pila 12/21/2018, 5:02 PM

## 2018-12-21 NOTE — Anesthesia Procedure Notes (Signed)
Epidural Patient location during procedure: OB Start time: 12/21/2018 9:39 PM End time: 12/21/2018 9:49 PM  Staffing Anesthesiologist: Achille Rich, MD Performed: anesthesiologist   Preanesthetic Checklist Completed: patient identified, site marked, pre-op evaluation, timeout performed, IV checked, risks and benefits discussed and monitors and equipment checked  Epidural Patient position: sitting Prep: DuraPrep Patient monitoring: heart rate, cardiac monitor, continuous pulse ox and blood pressure Approach: midline Location: L2-L3 Injection technique: LOR saline  Needle:  Needle type: Tuohy  Needle gauge: 17 G Needle length: 9 cm Needle insertion depth: 4 cm Catheter type: closed end flexible Catheter size: 19 Gauge Catheter at skin depth: 11 cm Test dose: negative and Other  Assessment Events: blood not aspirated, injection not painful, no injection resistance and negative IV test  Additional Notes Informed consent obtained prior to proceeding including risk of failure, 1% risk of PDPH, risk of minor discomfort and bruising.  Discussed rare but serious complications including epidural abscess, permanent nerve injury, epidural hematoma.  Discussed alternatives to epidural analgesia and patient desires to proceed.  Timeout performed pre-procedure verifying patient name, procedure, and platelet count.  Patient tolerated procedure well. Reason for block:procedure for pain

## 2018-12-21 NOTE — MAU Note (Signed)
Pt was in office today 3cm. Ctx have been getting closer together now about apart. No LOF, just some bloody show. +FM.

## 2018-12-21 NOTE — Anesthesia Preprocedure Evaluation (Signed)
Anesthesia Evaluation  Patient identified by MRN, date of birth, ID band Patient awake    Reviewed: Allergy & Precautions, H&P , NPO status , Patient's Chart, lab work & pertinent test results  Airway Mallampati: II   Neck ROM: full    Dental   Pulmonary asthma ,    breath sounds clear to auscultation       Cardiovascular negative cardio ROS   Rhythm:regular Rate:Normal     Neuro/Psych Anxiety    GI/Hepatic   Endo/Other    Renal/GU      Musculoskeletal   Abdominal   Peds  Hematology   Anesthesia Other Findings   Reproductive/Obstetrics (+) Pregnancy                             Anesthesia Physical Anesthesia Plan  ASA: II  Anesthesia Plan: Epidural   Post-op Pain Management:    Induction: Intravenous  PONV Risk Score and Plan: 2 and Treatment may vary due to age or medical condition  Airway Management Planned: Natural Airway  Additional Equipment:   Intra-op Plan:   Post-operative Plan:   Informed Consent: I have reviewed the patients History and Physical, chart, labs and discussed the procedure including the risks, benefits and alternatives for the proposed anesthesia with the patient or authorized representative who has indicated his/her understanding and acceptance.       Plan Discussed with: Anesthesiologist  Anesthesia Plan Comments:         Anesthesia Quick Evaluation

## 2018-12-21 NOTE — Progress Notes (Signed)
Patient ID: Renee Matthews, female   DOB: May 02, 1996, 23 y.o.   MRN: [id number]  See Dr Berenda Morale H&P - pt called with painful ctx, to MAU for eval - cervical change and regular ctx noted  Admitted to L&D for labor  Tdap in [facility] 2/11 Glucola 131  Plan per Dr Berenda Morale plan - Will AROM when comfortable with epidural  FHTs 150, mod var, + accels, category 1 toco q 2-3 min

## 2018-12-22 ENCOUNTER — Encounter (HOSPITAL_COMMUNITY): Payer: Self-pay | Admitting: Obstetrics and Gynecology

## 2018-12-22 ENCOUNTER — Inpatient Hospital Stay (HOSPITAL_COMMUNITY): Payer: Medicaid Other

## 2018-12-22 LAB — ABO/RH: ABO/RH(D): B POS

## 2018-12-22 LAB — CBC
HCT: 28.9 % — ABNORMAL LOW (ref 36.0–46.0)
Hemoglobin: 9.3 g/dL — ABNORMAL LOW (ref 12.0–15.0)
MCH: 27 pg (ref 26.0–34.0)
MCHC: 32.2 g/dL (ref 30.0–36.0)
MCV: 83.8 fL (ref 80.0–100.0)
Platelets: 293 10*3/uL (ref 150–400)
RBC: 3.45 MIL/uL — ABNORMAL LOW (ref 3.87–5.11)
RDW: 13.9 % (ref 11.5–15.5)
WBC: 20.6 10*3/uL — ABNORMAL HIGH (ref 4.0–10.5)
nRBC: 0 % (ref 0.0–0.2)

## 2018-12-22 LAB — RPR: RPR Ser Ql: NONREACTIVE

## 2018-12-22 MED ORDER — OXYCODONE HCL 5 MG PO TABS: 10.0000 mg | ORAL_TABLET | ORAL | Status: DC | PRN

## 2018-12-22 MED ORDER — ONDANSETRON HCL 4 MG/2ML IJ SOLN: 4.0000 mg | INTRAMUSCULAR | Status: DC | PRN

## 2018-12-22 MED ORDER — IBUPROFEN 600 MG PO TABS
600.0000 mg | ORAL_TABLET | Freq: Four times a day (QID) | ORAL | Status: DC
  Administered 2018-12-22 – 2018-12-24 (×10): 600 mg via ORAL
  Filled 2018-12-22 (×10): qty 1

## 2018-12-22 MED ORDER — PRENATAL MULTIVITAMIN CH
1.0000 | ORAL_TABLET | Freq: Every day | ORAL | Status: DC
  Administered 2018-12-22 – 2018-12-24 (×3): 1 via ORAL
  Filled 2018-12-22 (×3): qty 1

## 2018-12-22 MED ORDER — DIBUCAINE (PERIANAL) 1 % EX OINT: 1.0000 "application " | TOPICAL_OINTMENT | CUTANEOUS | Status: DC | PRN

## 2018-12-22 MED ORDER — SIMETHICONE 80 MG PO CHEW: 80.0000 mg | CHEWABLE_TABLET | ORAL | Status: DC | PRN

## 2018-12-22 MED ORDER — OXYCODONE HCL 5 MG PO TABS
5.0000 mg | ORAL_TABLET | ORAL | Status: DC | PRN
  Administered 2018-12-22 – 2018-12-23 (×6): 5 mg via ORAL
  Filled 2018-12-22 (×7): qty 1

## 2018-12-22 MED ORDER — DIPHENHYDRAMINE HCL 25 MG PO CAPS: 25.0000 mg | ORAL_CAPSULE | Freq: Four times a day (QID) | ORAL | Status: DC | PRN

## 2018-12-22 MED ORDER — ACETAMINOPHEN 325 MG PO TABS
650.0000 mg | ORAL_TABLET | ORAL | Status: DC | PRN
  Administered 2018-12-22 – 2018-12-24 (×4): 650 mg via ORAL
  Filled 2018-12-22 (×4): qty 2

## 2018-12-22 MED ORDER — ZOLPIDEM TARTRATE 5 MG PO TABS: 5.0000 mg | ORAL_TABLET | Freq: Every evening | ORAL | Status: DC | PRN

## 2018-12-22 MED ORDER — WITCH HAZEL-GLYCERIN EX PADS: 1.0000 "application " | MEDICATED_PAD | CUTANEOUS | Status: DC | PRN

## 2018-12-22 MED ORDER — SENNOSIDES-DOCUSATE SODIUM 8.6-50 MG PO TABS
2.0000 | ORAL_TABLET | ORAL | Status: DC
  Administered 2018-12-22 – 2018-12-23 (×2): 2 via ORAL
  Filled 2018-12-22 (×2): qty 2

## 2018-12-22 MED ORDER — ONDANSETRON HCL 4 MG PO TABS: 4.0000 mg | ORAL_TABLET | ORAL | Status: DC | PRN

## 2018-12-22 MED ORDER — LACTATED RINGERS IV SOLN: INTRAVENOUS | Status: DC

## 2018-12-22 MED ORDER — COCONUT OIL OIL
1.0000 "application " | TOPICAL_OIL | Status: DC | PRN
  Administered 2018-12-22: 1 via TOPICAL

## 2018-12-22 MED ORDER — BENZOCAINE-MENTHOL 20-0.5 % EX AERO: 1.0000 "application " | INHALATION_SPRAY | CUTANEOUS | Status: DC | PRN

## 2018-12-22 NOTE — Lactation Note (Signed)
This note was copied from a baby's chart. Lactation Consultation Note  Patient Name: Renee Matthews [id number] Date: 12/22/2018 Reason for consult: Initial assessment;Term P3  Mom breastfed her first baby for 6 months and last baby for 18 months. Newborn  is 38 hours old and has been to breast 4 times.  He is currently sleepy.  Clothes removed and baby skin to skin.  Mom attempting to latch baby but he is not opening mouth or showing interest in feeding.  Reassured mom and instructed to watch for feeding cues.  Encouraged to call for assist prn.  Breastfeeding consultation services and support information given and reviewed.  Maternal Data Does the patient have breastfeeding experience prior to this delivery?: Yes  Feeding Feeding Type: Breast Fed  LATCH Score Latch: Too sleepy or reluctant, no latch achieved, no sucking elicited.  Audible Swallowing: None  Type of Nipple: Everted at rest and after stimulation  Comfort (Breast/Nipple): Soft / non-tender  Hold (Positioning): No assistance needed to correctly position infant at breast.  LATCH Score: 6  Interventions    Lactation Tools Discussed/Used     Consult Status Consult Status: Follow-up Date: 12/23/18 Follow-up type: In-patient    [name] 12/22/2018, 2:01 PM

## 2018-12-22 NOTE — Progress Notes (Signed)
MOB was referred for history of depression/anxiety. * Referral screened out by Clinical Social Worker because none of the following criteria appear to apply: ~ History of anxiety/depression during this pregnancy, or of post-partum depression following prior delivery. ~ Diagnosis of anxiety and/or depression within last 3 years OR * MOB's symptoms currently being treated with medication and/or therapy. Per H&P MOB currently on Zoloft for anxiety/depression.  Please contact the Clinical Social Worker if needs arise, by MOB request, or if MOB scores greater than 9/yes to question 10 on Edinburgh Postpartum Depression Screen.    Renee Matthews, MSW, LCSW-A [facility] [phone] 

## 2018-12-22 NOTE — Anesthesia Postprocedure Evaluation (Signed)
Anesthesia Post Note  Patient: Renee Matthews  Procedure(s) Performed: AN AD HOC LABOR EPIDURAL     Patient location during evaluation: Mother Baby Anesthesia Type: Epidural Level of consciousness: awake and alert Pain management: pain level controlled Vital Signs Assessment: post-procedure vital signs reviewed and stable Respiratory status: spontaneous breathing, nonlabored ventilation and respiratory function stable Cardiovascular status: stable Postop Assessment: no headache, no backache, epidural receding, no apparent nausea or vomiting, patient able to bend at knees, able to ambulate and adequate PO intake Anesthetic complications: no    Last Vitals:  Vitals:   12/22/18 0420 12/22/18 0520  BP: 111/62 (!) 110/55  Pulse: (!) 107 (!) 110  Resp: 16 18  Temp: 37.7 C 37.7 C  SpO2: 99% 98%    Last Pain:  Vitals:   12/22/18 0520  TempSrc: Oral  PainSc: 0-No pain   Pain Goal:                   [place]

## 2018-12-22 NOTE — Progress Notes (Signed)
Patient ID: Renee Matthews, female   DOB: 1996/03/24, 23 y.o.   MRN: [id number] DOD  Pt doing well.    afeb VSS  Fundus firm  Lochia appears normal  Continue care Plans circumcision in office

## 2018-12-23 NOTE — Progress Notes (Signed)
Post Partum Day 1 Subjective: up ad lib, voiding, tolerating PO, + flatus and pain well controlled. She reports some clots this am otherwise lochia as expected. She is bonding well with baby - breastfeeding. Baby being monitored for bilirubin levels so pt prefers to wait for discharge tomorrow  No HA, CP, SOB  Objective: Blood pressure 124/73, pulse 81, temperature 98.7 F (37.1 C), resp. rate 18, height 5\' 2"  (1.575 m), weight 88.5 kg, last menstrual period 03/24/2018, SpO2 100 %, unknown if currently breastfeeding.  Physical Exam:  General: alert, cooperative and no distress Lochia: appropriate Uterine Fundus: firm Incision: n/a DVT Evaluation: No evidence of DVT seen on physical exam. Negative Homan's sign.  Recent Labs    12/21/18 2049 12/22/18 0530  HGB 10.3* 9.3*  HCT 31.3* 28.9*    Assessment/Plan: Plan for discharge tomorrow  Circumcision in office   LOS: 2 days   Mitsuru Dault W Jamelle Goldston 12/23/2018, 10:26 AM

## 2018-12-23 NOTE — Progress Notes (Signed)
CSW received consult due to score 10on Edinburgh Depression Screen.    CSW spoke with Renee Matthews at bedside. Upon entering the room CSW observed that Renee Matthews was sitting up in recliner holding infant while gentleman lied in bed. CSW was informed that gentleman was FOB (Tyler Flint). CSW introduced role as well as congratulated Renee Matthews and FOB on birth of infant (Lochlan). CSW explained HIPPA policy with Renee Matthews and Renee Matthews desired that Fob remain in room. CSW understanding and began assessing Renee Matthews for further meds.    CSw was advised that Renee Matthews is on zoloft at this time for anxiety. Renee Matthews expressed that she has not been getting this medication while in the hospital-reason being why she has been feeling the way she has. Renee Matthews expressed that this is a result of her scoring 10 on edinburgh. CSW spoke with Renee Matthews about how she is feeling at this time. Renee Matthews expressed that she is feeling fine and has not been feeling SI or HI at this time. Renee Matthews and FOB expressed that Renee Matthews has supports from FOB and a friend.   Renee Matthews expressed that she did have PPD with her older son. Symptoms for Renee Matthews included feelings of sadness as well as feeling overwhelmed with everything taking place. CSW educated Renee Matthews on PPD as well as SIDS. Renee Matthews was given Postpartum Progress Checklist to keep track of feelings and how often they occur. Renee Matthews expressed that she has all needed items to care for infant at this time. CSW forsees no further barriers to discharge once Renee Matthews and infant are both medically stable.  CSW provided education regarding Baby Blues vs PMADs and provided Renee Matthews with resources for mental health follow up.  CSW encouraged Renee Matthews to evaluate her mental health throughout the postpartum period with the use of the New Mom Checklist developed by Postpartum Progress as well as the Edinburgh Postnatal Depression Scale and notify a medical professional if symptoms arise.      Jaquayla Hege S. Siani Utke, MSW, LCSW-A [facility]  [phone] 

## 2018-12-23 NOTE — Lactation Note (Signed)
This note was copied from a baby's chart. Lactation Consultation Note  Patient Name: Renee Matthews IYMEB'R Date: 12/23/2018 Reason for consult: Follow-up assessment Baby is 33 hours/6% weight loss.  Mom states baby is cluster feeding.  She is not sure latch is deep enough.  Discussed cluster feeding and milk coming to volume. Reassured.  Baby awake and ready to feed.  Mom attempting to latch baby in cradle hold with no pillow support.  Baby positioned on pillow in cross cradle hold.  Instructed on bringing baby to breast quickly after wide open mouth.  Baby latched well with good depth.  Mom shown how to use breast massage during feeding.  Encouraged to call for assist prn.  Maternal Data    Feeding Feeding Type: Breast Fed  LATCH Score Latch: Grasps breast easily, tongue down, lips flanged, rhythmical sucking.  Audible Swallowing: A few with stimulation  Type of Nipple: Everted at rest and after stimulation  Comfort (Breast/Nipple): Soft / non-tender  Hold (Positioning): Assistance needed to correctly position infant at breast and maintain latch.  LATCH Score: 8  Interventions Interventions: Assisted with latch;Breast compression;Skin to skin;Adjust position;Breast massage;Support pillows;Position options  Lactation Tools Discussed/Used     Consult Status Consult Status: Follow-up Date: 12/24/18 Follow-up type: In-patient    [name] 12/23/2018, 11:52 AM

## 2018-12-24 MED ORDER — IBUPROFEN 600 MG PO TABS: 600.0000 mg | ORAL_TABLET | Freq: Four times a day (QID) | ORAL | 1 refills | Status: DC | PRN

## 2018-12-24 NOTE — Discharge Summary (Signed)
OB Discharge Summary     Patient Name: Renee Matthews DOB: 1996/02/10 MRN: [id number]  Date of admission: 12/21/2018 Delivering MD: Sherian ReinBOVARD-STUCKERT, JODY   Date of discharge: 12/24/2018  Admitting diagnosis: CTX Intrauterine pregnancy: 279w0d     Secondary diagnosis:  Principal Problem:   SVD (spontaneous vaginal delivery) Active Problems:   Pregnant  Additional problems: none     Discharge diagnosis: Term Pregnancy Delivered                                                                                                Post partum procedures:none  Augmentation: AROM and Pitocin  Complications: None  Hospital course:  Induction of Labor With Vaginal Delivery   23 y.o. yo G3P3003 at 569w0d was admitted to the hospital 12/21/2018 for induction of labor.  Indication for induction: Favorable cervix at term.  Patient had an uncomplicated labor course as follows: Membrane Rupture Time/Date: 10:48 PM ,12/21/2018   Intrapartum Procedures: Episiotomy: None [1]                                         Lacerations:  None [1]  Patient had delivery of a Viable infant.  Information for the patient's newborn:  Andreas BlowerOdell, Boy Kahlea [[id number]][[id number]]  Delivery Method: Vag-Spont   12/22/2018  Details of delivery can be found in separate delivery note.  Patient had a routine postpartum course. Patient is discharged home 12/24/18.  Physical exam  Vitals:   12/23/18 0500 12/23/18 1413 12/23/18 2206 12/24/18 0529  BP: 124/73 122/73 118/69 113/67  Pulse: 81 89 83 75  Resp: 18 16 16 18   Temp: 98.7 F (37.1 C) 98.3 F (36.8 C) 99.1 F (37.3 C) 98.6 F (37 C)  TempSrc:  Oral Oral Oral  SpO2: 100%  99% 99%  Weight:      Height:       General: alert, cooperative and no distress Lochia: appropriate Uterine Fundus: firm Incision: N/A DVT Evaluation: No evidence of DVT seen on physical exam. Negative Homan's sign. Labs: Lab Results  Component Value Date   WBC 20.6 (H) 12/22/2018   HGB 9.3 (L)  12/22/2018   HCT 28.9 (L) 12/22/2018   MCV 83.8 12/22/2018   PLT 293 12/22/2018   CMP Latest Ref Rng & Units 12/21/2018  Glucose 70 - 99 mg/dL 132(G116(H)  BUN 6 - 20 mg/dL 5(L)  Creatinine 4.010.44 - 1.00 mg/dL 0.270.56  Sodium 253135 - 664145 mmol/L 137  Potassium 3.5 - 5.1 mmol/L 3.3(L)  Chloride 98 - 111 mmol/L 105  CO2 22 - 32 mmol/L 20(L)  Calcium 8.9 - 10.3 mg/dL 9.0  Total Protein 6.5 - 8.1 g/dL 6.5  Total Bilirubin 0.3 - 1.2 mg/dL 0.4  Alkaline Phos 38 - 126 U/L 146(H)  AST 15 - 41 U/L 15  ALT 0 - 44 U/L 10    Discharge instruction: per After Visit Summary and "Baby and Me Booklet".  After visit meds:  Allergies as of 12/24/2018   No Known Allergies  Medication List    STOP taking these medications   Diclegis 10-10 MG Tbec Generic drug:  Doxylamine-Pyridoxine     TAKE these medications   ibuprofen 600 MG tablet Commonly known as:  ADVIL Take 1 tablet (600 mg total) by mouth every 6 (six) hours as needed for moderate pain or cramping.   prenatal multivitamin Tabs tablet Take 1 tablet by mouth daily at 12 noon.   sertraline 50 MG tablet Commonly known as:  ZOLOFT Take 50 mg by mouth daily.       Diet: routine diet  Activity: Advance as tolerated. Pelvic rest for 6 weeks.   Outpatient follow JZ:PHXTAVWP circumcision within two weeks and postpartum visit in 6 weeks Follow up Appt:No future appointments. Follow up Visit:No follow-ups on file.  Postpartum contraception: Not Discussed  Newborn Data: Live born female  Birth Weight: 7 lb 9.5 oz (3445 g) APGAR: 8, 9  Newborn Delivery   Birth date/time:  12/22/2018 02:08:00 Delivery type:  Vaginal, Spontaneous     Baby Feeding: Breast Disposition:home with mother   12/24/2018 Cathrine Muster, DO

## 2018-12-24 NOTE — Discharge Instructions (Signed)
Call with any concerns [phone]

## 2018-12-24 NOTE — Progress Notes (Signed)
Patient ID: Renee Matthews, female   DOB: Oct 01, 1995, 23 y.o.   MRN: [id number] Pt doing well with no complaints. Pain well controlled. Ambulating, voiding, tolerating PO with no issues. Lochia mild-moderate. Denies CP, SOB, HA, fever. Bonding well with baby. Ready for discharge to home today VSS ABD - FF and 4cm below umbilicus EXT - no edema or homans  A/P: PPD#2 s/p svd - stable          Discharge instructions reviewed         Circ in office and 6 week pp visit

## 2018-12-24 NOTE — Lactation Note (Signed)
This note was copied from a baby's chart. Lactation Consultation Note  Patient Name: Renee Matthews FOYDX'A Date: 12/24/2018 Reason for consult: Follow-up assessment;Term  P3 mother whose infant is now 83 hours old.  This is a term infant with a 9% weight loss this a.m.  Mother has breast feeding experience and breast fed her first baby for 6 months and her second baby for 16 months.  Mother stated that baby had just finished feeding prior to my arrival.  He was asleep in mother's arms.    Mother's breasts are soft and non tender and nipples are everted. Right nipple has been a little sensitive and started to crack.  Upon assessment this a.m. it looks slightly irritated but mother is using coconut oil for comfort.  She has not been feeding from this breast as much as the left breast.  Encouraged her to continue doing hand expression and to use her pump if she does not feed from that breast.    Mother is familiar with engorgement; reviewed briefly.  Provided a manual pump for home use.  She did not want to open the pump up to review, stating she was familiar with how to use this pump.  She has a DEBP for home use also.  Mother has our OP phone number for questions/concerns after discharge.  Father present.   Maternal Data Formula Feeding for Exclusion: No Has patient been taught Hand Expression?: Yes Does the patient have breastfeeding experience prior to this delivery?: Yes  Feeding Feeding Type: Breast Fed  LATCH Score Latch: Grasps breast easily, tongue down, lips flanged, rhythmical sucking.  Audible Swallowing: Spontaneous and intermittent  Type of Nipple: Everted at rest and after stimulation  Comfort (Breast/Nipple): Filling, red/small blisters or bruises, mild/mod discomfort  Hold (Positioning): Assistance needed to correctly position infant at breast and maintain latch.  LATCH Score: 8  Interventions    Lactation Tools Discussed/Used Pump Review: (Mother stated she  knew how to use manual pump)   Consult Status Consult Status: Complete Date: 12/24/18 Follow-up type: Call as needed    Irene Pap Jyrah Blye 12/24/2018, 9:19 AM

## 2020-07-21 ENCOUNTER — Emergency Department (HOSPITAL_COMMUNITY): Payer: Medicaid Other

## 2020-07-21 ENCOUNTER — Other Ambulatory Visit: Payer: Self-pay

## 2020-07-21 ENCOUNTER — Encounter (HOSPITAL_COMMUNITY): Payer: Self-pay

## 2020-07-21 ENCOUNTER — Emergency Department (HOSPITAL_COMMUNITY)
Admission: EM | Admit: 2020-07-21 | Discharge: 2020-07-21 | Disposition: A | Discharge status: Home / Self Care | Payer: Medicaid Other | Attending: Emergency Medicine | Admitting: Emergency Medicine

## 2020-07-21 DIAGNOSIS — K8050 Calculus of bile duct without cholangitis or cholecystitis without obstruction: Secondary | ICD-10-CM | POA: Insufficient documentation

## 2020-07-21 DIAGNOSIS — J45909 Unspecified asthma, uncomplicated: Secondary | ICD-10-CM | POA: Diagnosis not present

## 2020-07-21 DIAGNOSIS — R1011 Right upper quadrant pain: Secondary | ICD-10-CM | POA: Diagnosis present

## 2020-07-21 LAB — CBC
HCT: 46.2 % — ABNORMAL HIGH (ref 36.0–46.0)
Hemoglobin: 15.7 g/dL — ABNORMAL HIGH (ref 12.0–15.0)
MCH: 29.2 pg (ref 26.0–34.0)
MCHC: 34 g/dL (ref 30.0–36.0)
MCV: 86 fL (ref 80.0–100.0)
Platelets: 422 10*3/uL — ABNORMAL HIGH (ref 150–400)
RBC: 5.37 MIL/uL — ABNORMAL HIGH (ref 3.87–5.11)
RDW: 13.3 % (ref 11.5–15.5)
WBC: 7.5 10*3/uL (ref 4.0–10.5)
nRBC: 0 % (ref 0.0–0.2)

## 2020-07-21 LAB — COMPREHENSIVE METABOLIC PANEL
ALT: 29 U/L (ref 0–44)
AST: 26 U/L (ref 15–41)
Albumin: 5.2 g/dL — ABNORMAL HIGH (ref 3.5–5.0)
Alkaline Phosphatase: 61 U/L (ref 38–126)
Anion gap: 11 (ref 5–15)
BUN: 10 mg/dL (ref 6–20)
CO2: 22 mmol/L (ref 22–32)
Calcium: 9.8 mg/dL (ref 8.9–10.3)
Chloride: 108 mmol/L (ref 98–111)
Creatinine, Ser: 0.72 mg/dL (ref 0.44–1.00)
GFR, Estimated: >60 mL/min (ref >60)
Glucose, Bld: 102 mg/dL — ABNORMAL HIGH (ref 70–99)
Potassium: 3.5 mmol/L (ref 3.5–5.1)
Sodium: 141 mmol/L (ref 135–145)
Total Bilirubin: 0.4 mg/dL (ref 0.3–1.2)
Total Protein: 8.5 g/dL — ABNORMAL HIGH (ref 6.5–8.1)

## 2020-07-21 LAB — URINALYSIS, ROUTINE W REFLEX MICROSCOPIC
Bilirubin Urine: NEGATIVE
Glucose, UA: NEGATIVE mg/dL
Hgb urine dipstick: NEGATIVE
Ketones, ur: NEGATIVE mg/dL
Leukocytes,Ua: NEGATIVE
Nitrite: NEGATIVE
Protein, ur: NEGATIVE mg/dL
Specific Gravity, Urine: 1.008 (ref 1.005–1.030)
pH: 7 (ref 5.0–8.0)

## 2020-07-21 LAB — I-STAT BETA HCG BLOOD, ED (MC, WL, AP ONLY): I-stat hCG, quantitative: <5 m[IU]/mL (ref <5)

## 2020-07-21 LAB — LIPASE, BLOOD: Lipase: 49 U/L (ref 11–51)

## 2020-07-21 MED ORDER — MORPHINE SULFATE (PF) 2 MG/ML IV SOLN
2.0000 mg | Freq: Once | INTRAVENOUS | Status: AC
  Administered 2020-07-21: 2 mg via INTRAVENOUS
  Filled 2020-07-21: qty 1

## 2020-07-21 MED ORDER — ONDANSETRON HCL 4 MG PO TABS: 4.0000 mg | ORAL_TABLET | Freq: Four times a day (QID) | ORAL | 0 refills | Status: AC

## 2020-07-21 MED ORDER — SODIUM CHLORIDE 0.9 % IV BOLUS
1000.0000 mL | Freq: Once | INTRAVENOUS | Status: AC
  Administered 2020-07-21: 1000 mL via INTRAVENOUS

## 2020-07-21 NOTE — ED Provider Notes (Signed)
[facility] DEPT Provider Note   CSN: [id number] Arrival date & time: 07/21/20  1214     History Chief Complaint  Patient presents with  . Abdominal Pain    Renee Matthews is a 24 y.o. female.  HPI   Patient with significant medical history of anxiety, asthma, gallstones presents to the emergency department with chief complaint of right upper and left upper abdominal pain.  Patient states pain started yesterday suddenly, after she ate pork chops.  She endorses that the pain achy and does not radiate into her back, she denies nausea, vomiting, diarrhea, pedal edema.  She does endorse that she has had increased urination but denies hematuria, dysuria, vaginal discharge or vaginal bleeding.  Patient states that she has history of gallstones as seen with ultrasound last year, she has not follow-up with a surgeon for further evaluation.  She states she generally gets pains like this every so often but states this is the worst it has ever felt.  She has not eaten any food at this time and states any food makes the pain worse.  Patient has no other GI history.  patient denies fevers, chills, shortness of breath, chest pain, abdominal pain, nausea, vomiting, diarrhea, pedal edema. Past Medical History:  Diagnosis Date  . Anxiety   . Asthma   . Gallstones   . ROM (rupture of membranes), premature 03/04/2017  . SVD (spontaneous vaginal delivery) 12/22/2018    Patient Active Problem List   Diagnosis Date Noted  . SVD (spontaneous vaginal delivery) 12/22/2018  . Pregnant 12/21/2018  . Indication for care in labor or delivery 03/04/2017  . ROM (rupture of membranes), premature 03/04/2017  . Postpartum care following vaginal delivery 03/04/2017    Past Surgical History:  Procedure Laterality Date  . WISDOM TOOTH EXTRACTION       OB History    Gravida  3   Para  3   Term  3   Preterm      AB      Living  3     SAB      TAB      Ectopic       Multiple  0   Live Births  3           Family History  Problem Relation Age of Onset  . Hypertension Maternal Aunt   . Diabetes Maternal Grandmother   . Heart disease Maternal Grandmother   . Cancer Maternal Grandfather     Social History   Tobacco Use  . Smoking status: Never Smoker  . Smokeless tobacco: Never Used  Vaping Use  . Vaping Use: Never used  Substance Use Topics  . Alcohol use: No  . Drug use: No    Home Medications Prior to Admission medications   Medication Sig Start Date End Date Taking? Authorizing Provider  diphenhydrAMINE (SOMINEX) 25 MG tablet Take 25 mg by mouth at bedtime as needed for itching, allergies or sleep.   Yes [provider]  ibuprofen (ADVIL) 600 MG tablet Take 1 tablet (600 mg total) by mouth every 6 (six) hours as needed for moderate pain or cramping. 12/24/18  Yes Banga, Cecilia Worema, DO  sertraline (ZOLOFT) 50 MG tablet Take 50 mg by mouth daily.   Yes [provider]    Allergies    Patient has no known allergies.  Review of Systems   Review of Systems  Constitutional: Negative for chills and fever.  HENT: Negative for congestion.  Respiratory: Negative for shortness of breath.   Cardiovascular: Negative for chest pain.  Gastrointestinal: Positive for abdominal pain. Negative for diarrhea, nausea and vomiting.  Genitourinary: Positive for frequency. Negative for dysuria, enuresis and flank pain.  Musculoskeletal: Negative for back pain.  Skin: Negative for rash.  Neurological: Negative for dizziness and headaches.  Hematological: Does not bruise/bleed easily.    Physical Exam Updated Vital Signs BP 118/80 (BP Location: Left Arm)   Pulse 75   Temp 98.2 F (36.8 C) (Oral)   Resp 17   LMP 06/16/2020 (Approximate)   SpO2 97%   Physical Exam Vitals and nursing note reviewed.  Constitutional:      General: She is not in acute distress.    Appearance: She is not ill-appearing.  HENT:     Head:  Normocephalic and atraumatic.     Nose: No congestion.  Eyes:     Conjunctiva/sclera: Conjunctivae normal.  Cardiovascular:     Rate and Rhythm: Normal rate and regular rhythm.     Pulses: Normal pulses.     Heart sounds: No murmur heard.  No friction rub. No gallop.   Pulmonary:     Effort: No respiratory distress.     Breath sounds: No wheezing, rhonchi or rales.  Abdominal:     Palpations: Abdomen is soft.     Tenderness: There is abdominal tenderness. There is no right CVA tenderness or left CVA tenderness.     Comments: Patient's abdomen was visualized it was nondistended, normoactive bowel sounds, dull to percussion.  She had tenderness to palpation in her right upper quadrant and left upper quadrant. negative Murphy sign, negative rebound tenderness, negative McBurney point, no peritoneal sign noted.  Negative CVA tenderness  Musculoskeletal:     Right lower leg: No edema.     Left lower leg: No edema.  Skin:    General: Skin is warm and dry.  Neurological:     Mental Status: She is alert.     Comments: Patient was having no difficult with word finding.  Psychiatric:        Mood and Affect: Mood normal.     ED Results / Procedures / Treatments   Labs (all labs ordered are listed, but only abnormal results are displayed) Labs Reviewed  COMPREHENSIVE METABOLIC PANEL - Abnormal; Notable for the following components:      Result Value   Glucose, Bld 102 (*)    Total Protein 8.5 (*)    Albumin 5.2 (*)    All other components within normal limits  CBC - Abnormal; Notable for the following components:   RBC 5.37 (*)    Hemoglobin 15.7 (*)    HCT 46.2 (*)    Platelets 422 (*)    All other components within normal limits  URINALYSIS, ROUTINE W REFLEX MICROSCOPIC - Abnormal; Notable for the following components:   Color, Urine STRAW (*)    All other components within normal limits  LIPASE, BLOOD  I-STAT BETA HCG BLOOD, ED ([facility], [facility], [facility] ONLY)     EKG None  Radiology No results found.  Procedures Procedures (including critical care time)  Medications Ordered in ED Medications  sodium chloride 0.9 % bolus 1,000 mL (0 mLs Intravenous Paused 07/21/20 1442)  morphine 2 MG/ML injection 2 mg (2 mg Intravenous Given 07/21/20 1434)    ED Course  I have reviewed the triage vital signs and the nursing notes.  Pertinent labs & imaging results that were available during my care of the  patient were reviewed by me and considered in my medical decision making (see chart for details).    MDM Rules/Calculators/A&P                          Patient presents with upper quadrant pain.  She is alert, does not appear in acute distress, vital signs reassuring.  Will obtain basic lab work and obtain limited ultrasound for further evaluation.  After my exam nursing staff informing that patient was in pain and would like some pain medications.  Will start patient on morphine and provide her with fluids and reevaluate.  Patient was reevaluated at the provided with fluids and morphine, she states her pain has almost fully resolved.  Abdomen was palpated it was soft and she had slight tenderness in the right upper quadrant but she states she feels much better.  She is tolerating p.o. without difficulty.  Feel comfortable discharging patient home.  CBC negative for leukocytosis, shows hemoconcentrated with red blood cells of 5.37, hemoglobin 15.7.  CMP negative for electrolyte abnormalities, no metabolic acidosis, hyperglycemia of 102, no elevation liver enzymes, no anion gap noted.  UA negative for nitrates, leukocytes, hematuria.  Lipase 49, i-STAT beta hCG less than 5.  Limited ultrasound shows gallbladder somewhat contracted largely filled with gallstones, shows probable hepatic steatosis   Low suspicion for systemic infection as patient is nontoxic-appearing, vital signs reassuring. Low suspicion for intra-abdominal abnormality requiring immediate  intervention as as there is no acute abdomen on exam, no elevation in liver enzymes or alk phos, lipase is 49.  Patient is tolerating p.o. without difficulty.  low suspicion for UTI or pyelonephritis as patient denies urinary symptoms, negative CVA tenderness, UA negative for nitrates or leukocytes, no hematuria noted on exam.  Suspect patient suffering from biliary colic will recommend that she follows up with general surgery for further evaluation.  Will provide her with dietary restrictions and antiemetics.  Vital signs have remained stable, no indication for hospital admission.  Patient given at home care as well strict return precautions.  Patient verbalized that they understood agreed to said plan.   Final Clinical Impression(s) / ED Diagnoses Final diagnoses:  None    Rx / DC Orders ED Discharge Orders    None       Marcello Fennel, PA-C 07/21/20 1645    Wyvonnia Dusky, MD 07/22/20 1249

## 2020-07-21 NOTE — ED Triage Notes (Signed)
Pt presents with c/o upper quadrant abdominal pain on both side. Pt reports she has been diagnosed with gallstones in the past. Pt reports the pain started after she ate last night.

## 2020-07-21 NOTE — ED Notes (Signed)
PO/Fluid challenge complete. Pt given ginger ale and crackers. Tolerated well.

## 2020-07-21 NOTE — Discharge Instructions (Addendum)
Seen here for right upper quadrant pain.  Lab work and imaging all looks reassuring.  I suspect you are suffering from biliary colic.  Provide you with dietary restrictions help prevent future flareups.  Also given you Zofran for nausea please take as prescribed.  Recommend that you follow-up with a general surgeon for further evaluation.  Vital signs have remained stable, no indication for hospital admission.  Patient discussed with attending and they agreed with assessment and plan.  Patient given at home care as well strict return precautions.  Patient verbalized that they understood agreed to said plan.

## 2021-04-06 ENCOUNTER — Encounter (HOSPITAL_COMMUNITY): Payer: Self-pay

## 2021-04-06 ENCOUNTER — Emergency Department (HOSPITAL_COMMUNITY)
Admission: EM | Admit: 2021-04-06 | Discharge: 2021-04-06 | Disposition: A | Discharge status: Home / Self Care | Payer: Medicaid Other | Attending: Emergency Medicine | Admitting: Emergency Medicine

## 2021-04-06 ENCOUNTER — Other Ambulatory Visit: Payer: Self-pay

## 2021-04-06 DIAGNOSIS — Z3A08 8 weeks gestation of pregnancy: Secondary | ICD-10-CM | POA: Diagnosis not present

## 2021-04-06 DIAGNOSIS — O210 Mild hyperemesis gravidarum: Secondary | ICD-10-CM | POA: Diagnosis not present

## 2021-04-06 DIAGNOSIS — O219 Vomiting of pregnancy, unspecified: Secondary | ICD-10-CM | POA: Diagnosis present

## 2021-04-06 DIAGNOSIS — J45909 Unspecified asthma, uncomplicated: Secondary | ICD-10-CM | POA: Diagnosis not present

## 2021-04-06 LAB — COMPREHENSIVE METABOLIC PANEL
ALT: 25 U/L (ref 0–44)
AST: 26 U/L (ref 15–41)
Albumin: 4.6 g/dL (ref 3.5–5.0)
Alkaline Phosphatase: 52 U/L (ref 38–126)
Anion gap: 8 (ref 5–15)
BUN: 9 mg/dL (ref 6–20)
CO2: 22 mmol/L (ref 22–32)
Calcium: 9.1 mg/dL (ref 8.9–10.3)
Chloride: 104 mmol/L (ref 98–111)
Creatinine, Ser: 0.66 mg/dL (ref 0.44–1.00)
GFR, Estimated: >60 mL/min (ref >60)
Glucose, Bld: 98 mg/dL (ref 70–99)
Potassium: 3.9 mmol/L (ref 3.5–5.1)
Sodium: 134 mmol/L — ABNORMAL LOW (ref 135–145)
Total Bilirubin: 0.6 mg/dL (ref 0.3–1.2)
Total Protein: 7.9 g/dL (ref 6.5–8.1)

## 2021-04-06 LAB — URINALYSIS, ROUTINE W REFLEX MICROSCOPIC
Bilirubin Urine: NEGATIVE
Glucose, UA: NEGATIVE mg/dL
Hgb urine dipstick: NEGATIVE
Ketones, ur: 5 mg/dL — AB
Leukocytes,Ua: NEGATIVE
Nitrite: NEGATIVE
Protein, ur: NEGATIVE mg/dL
Specific Gravity, Urine: 1.024 (ref 1.005–1.030)
pH: 6 (ref 5.0–8.0)

## 2021-04-06 LAB — CBC
HCT: 43.8 % (ref 36.0–46.0)
Hemoglobin: 14.4 g/dL (ref 12.0–15.0)
MCH: 29.2 pg (ref 26.0–34.0)
MCHC: 32.9 g/dL (ref 30.0–36.0)
MCV: 88.8 fL (ref 80.0–100.0)
Platelets: 343 10*3/uL (ref 150–400)
RBC: 4.93 MIL/uL (ref 3.87–5.11)
RDW: 12.9 % (ref 11.5–15.5)
WBC: 9.7 10*3/uL (ref 4.0–10.5)
nRBC: 0 % (ref 0.0–0.2)

## 2021-04-06 LAB — LIPASE, BLOOD: Lipase: 36 U/L (ref 11–51)

## 2021-04-06 LAB — POC URINE PREG, ED: Preg Test, Ur: POSITIVE — AB

## 2021-04-06 MED ORDER — PYRIDOXINE HCL 100 MG/ML IJ SOLN
100.0000 mg | Freq: Once | INTRAMUSCULAR | Status: AC
  Administered 2021-04-06: 100 mg via INTRAVENOUS
  Filled 2021-04-06: qty 1

## 2021-04-06 MED ORDER — SODIUM CHLORIDE 0.9 % IV BOLUS
1000.0000 mL | Freq: Once | INTRAVENOUS | Status: AC
  Administered 2021-04-06: 1000 mL via INTRAVENOUS

## 2021-04-06 MED ORDER — ONDANSETRON HCL 4 MG/2ML IJ SOLN
4.0000 mg | Freq: Once | INTRAMUSCULAR | Status: AC
  Administered 2021-04-06: 4 mg via INTRAVENOUS
  Filled 2021-04-06: qty 2

## 2021-04-06 MED ORDER — PROMETHAZINE HCL 25 MG PO TABS: 25.0000 mg | ORAL_TABLET | Freq: Four times a day (QID) | ORAL | 0 refills | Status: AC | PRN

## 2021-04-06 MED ORDER — METOCLOPRAMIDE HCL 5 MG/ML IJ SOLN
10.0000 mg | Freq: Once | INTRAMUSCULAR | Status: DC
  Filled 2021-04-06: qty 2

## 2021-04-06 NOTE — ED Provider Notes (Signed)
[redacted] weeks pregnant, Here with n/v -  Not tolerating fluids Treat with anti emetics   Pt otherwise benign in appearance Required multiple doses of anti emetics and IVF Improved and OK with d/c  Medical screening examination/treatment/procedure(s) were conducted as a shared visit with non-physician practitioner(s) and myself.  I personally evaluated the patient during the encounter.  Clinical Impression:   Final diagnoses:  Hyperemesis gravidarum         Eber Hong, MD 04/07/21 1544

## 2021-04-06 NOTE — ED Notes (Signed)
Pt states IVF was uncomfortable. IV site checked. No signs of infiltration, no swelling. IV has good blood return. IVF was programed at slower rate and pt still had discomfort. IVF hung to gravity and pt reported no pain at IV site.

## 2021-04-06 NOTE — ED Provider Notes (Signed)
[facility] EMERGENCY DEPARTMENT Provider Note   CSN: [id number] Arrival date & time: 04/06/21  1628     History Chief Complaint  Patient presents with   Nausea    Renee Matthews is a G3P3 25 y.o. female presenting with complaint of nausea, vomiting, diarrhea beginning Friday. Reports being [redacted] weeks pregnant. States that she is unable to keep anything down.  Additionally she says that she is having pain with urination.  Last Tuesday she saw her OB/GYN who gave her omeprazole for her upset stomach.  She believes this is what caused her diarrhea.  Denies any food allergies, changes in diet, hematochezia, melena, hematuria.  No abnormal vaginal discharge or bleeding.  No abdominal cramping. States that she feels as though she "just needs some fluids".  Denies any fevers, chills or known sick contacts.  Past Medical History:  Diagnosis Date   Anxiety    Asthma    Gallstones    ROM (rupture of membranes), premature 03/04/2017   SVD (spontaneous vaginal delivery) 12/22/2018    Patient Active Problem List   Diagnosis Date Noted   SVD (spontaneous vaginal delivery) 12/22/2018   Pregnant 12/21/2018   Indication for care in labor or delivery 03/04/2017   ROM (rupture of membranes), premature 03/04/2017   Postpartum care following vaginal delivery 03/04/2017    Past Surgical History:  Procedure Laterality Date   WISDOM TOOTH EXTRACTION       OB History     Gravida  3   Para  3   Term  3   Preterm      AB      Living  3      SAB      IAB      Ectopic      Multiple  0   Live Births  3           Family History  Problem Relation Age of Onset   Hypertension Maternal Aunt    Diabetes Maternal Grandmother    Heart disease Maternal Grandmother    Cancer Maternal Grandfather     Social History   Tobacco Use   Smoking status: Never   Smokeless tobacco: Never  Vaping Use   Vaping Use: Never used  Substance Use Topics   Alcohol use: No   Drug use: No     Home Medications Prior to Admission medications   Medication Sig Start Date End Date Taking? Authorizing Provider  diphenhydrAMINE (SOMINEX) 25 MG tablet Take 25 mg by mouth at bedtime as needed for itching, allergies or sleep.    [provider]  ibuprofen (ADVIL) 600 MG tablet Take 1 tablet (600 mg total) by mouth every 6 (six) hours as needed for moderate pain or cramping. 12/24/18   Banga, Cecilia Worema, DO  ondansetron (ZOFRAN) 4 MG tablet Take 1 tablet (4 mg total) by mouth every 6 (six) hours. 07/21/20   Carroll Sage, PA-C  sertraline (ZOLOFT) 50 MG tablet Take 50 mg by mouth daily.    [provider]    Allergies    Patient has no known allergies.  Review of Systems   Review of Systems  Constitutional:  Negative for chills and fever.  Respiratory:  Negative for shortness of breath and wheezing.   Cardiovascular:  Negative for chest pain and palpitations.  Gastrointestinal:  Positive for diarrhea, nausea and vomiting. Negative for abdominal pain.  Endocrine: Negative for polyuria.  Genitourinary:  Negative for dysuria, hematuria, urgency, vaginal bleeding and  vaginal discharge.  Musculoskeletal:  Negative for back pain.  Skin:  Negative for color change and pallor.  Allergic/Immunologic: Negative for food allergies.  Neurological:  Negative for dizziness, syncope, light-headedness and headaches.  All other systems reviewed and are negative.  Physical Exam Updated Vital Signs BP (!) 138/95 (BP Location: Right Arm)   Pulse 87   Temp 98.5 F (36.9 C) (Oral)   Resp 18   Ht 5\' 2"  (1.575 m)   Wt 86.6 kg   SpO2 99%   BMI 34.93 kg/m   Physical Exam Vitals and nursing note reviewed.  Constitutional:      Appearance: Normal appearance.  HENT:     Head: Normocephalic and atraumatic.     Mouth/Throat:     Mouth: Mucous membranes are dry.     Pharynx: Oropharynx is clear.  Eyes:     General: No scleral icterus.    Conjunctiva/sclera:  Conjunctivae normal.  Cardiovascular:     Rate and Rhythm: Normal rate and regular rhythm.  Pulmonary:     Effort: Pulmonary effort is normal. No respiratory distress.     Breath sounds: Normal breath sounds.  Abdominal:     General: Abdomen is flat.     Palpations: Abdomen is soft.     Tenderness: There is no abdominal tenderness.  Skin:    General: Skin is warm and dry.     Coloration: Skin is not jaundiced.     Findings: No rash.  Neurological:     Mental Status: She is alert.  Psychiatric:        Mood and Affect: Mood normal.    ED Results / Procedures / Treatments   Labs (all labs ordered are listed, but only abnormal results are displayed) Labs Reviewed  COMPREHENSIVE METABOLIC PANEL - Abnormal; Notable for the following components:      Result Value   Sodium 134 (*)    All other components within normal limits  URINALYSIS, ROUTINE W REFLEX MICROSCOPIC - Abnormal; Notable for the following components:   Ketones, ur 5 (*)    All other components within normal limits  POC URINE PREG, ED - Abnormal; Notable for the following components:   Preg Test, Ur Positive (*)    All other components within normal limits  LIPASE, BLOOD  CBC    EKG None  Radiology No results found.  Procedures Procedures   Medications Ordered in ED Medications - No data to display  ED Course  I have reviewed the triage vital signs and the nursing notes. Case discussed with MD .  Pertinent labs & imaging results that were available during my care of the patient were reviewed by me and considered in my medical decision making (see chart for details).  I discussed options for nausea with MD Hyacinth Meeker and MD Hyacinth Meeker.  Patient stated that metoclopramide has been giving her diarrhea at home, and that because she is driving I will defer Phenergan until discharge.  I informed the patient of the potential pregnancy risks associated with Zofran.  Patient voiced understanding and agreed to  receiving IV Zofran.  I spoke with patient's RN and on-call pharmacist about these orders.  Pharmacist voiced understanding and verify the order.  8:20pm: Patient still requesting antiemetics stating that she has not received anything for nausea.  RN messaged.  10:45 patient reported feeling better and is tolerating PO intake. Will d/c patient.  Clinical Course as of 04/06/21 2253  Sun Apr 06, 2021  1726 WBC: 9.7 [MR]  2102 Patient reevaluated by me and voiced provement of her nausea with Zofran.  Pharmacy working to find B6 solution, however it may not be necessary if we control her nausea without it.  Likely will discharge after final fluid bolus and nausea control with Zofran.  [MR]    Clinical Course User Index [MR] Joan Herschberger, Gabriel Cirri, PA-C   MDM Rules/Calculators/A&P                         Female presented at [redacted] weeks gestation with a complaint of nausea vomiting and diarrhea.  Pregnancy was confirmed with U POC today.  York Spaniel this has been going on for 2 days.  Has been taking metoclopramide at home, with no relief.  Believes that the metoclopramide has been giving her diarrhea.  Has had 3 previous pregnancies without this months NVD.  She requested some fluids and help with her nausea.  Because patient drove here we decided not to treat with Phenergan until discharge.  Patient has not been tolerating metoclopramide.  Zofran use in pregnancy is controversial, however after research and discussions with other providers and pharmacy the overall risk of 1 dose seemed low.  Agreed to Zofran after discussing the possible pregnancy effects.  Throughout her stay patient's vital signs remained stable.  Was never hypotensive.  CMP revealed no electrolyte abnormalities.  I suspect patient is suffering from hyperemesis gravidarum. Patient given 1 L IVBF and IV B6 and Zofran.  Stated that Reglan is what she has at home and has been giving her diarrhea.  Will discharge with 7 days Phenergan and a  follow-up with her OB/GYN for long-term management.   Final Clinical Impression(s) / ED Diagnoses Final diagnoses:  Hyperemesis gravidarum    Rx / DC Orders Results and diagnoses were explained to the patient. Return precautions discussed in full. Patient had no additional questions and expressed complete understanding.     Woodroe Chen 04/06/21 2259    Eber Hong, MD 04/07/21 [id number]

## 2021-04-06 NOTE — ED Notes (Signed)
Pt provided discharge instructions and prescription information. Pt was given the opportunity to ask questions and questions were answered. Discharge signature not obtained in the setting of the COVID-19 pandemic in order to reduce high touch surfaces.  ° °

## 2021-04-06 NOTE — Discharge Instructions (Addendum)
Take promethazine tablets as needed for nausea. Do not exceed four doses a day. Do not drive on this medication as it can make you drowsy. Follow-up with your OB for further treatment of your NV and inability to tolerate metoclopramide.

## 2021-04-06 NOTE — ED Triage Notes (Signed)
Pt. States they are [redacted] weeks pregnant and have not been able to eat or drink anything for two days. Pt. States they have been throwing up and it hurst when they urinate.

## 2021-04-15 LAB — OB RESULTS CONSOLE ABO/RH: RH Type: POSITIVE

## 2021-04-15 LAB — OB RESULTS CONSOLE HIV ANTIBODY (ROUTINE TESTING): HIV: NONREACTIVE

## 2021-04-15 LAB — OB RESULTS CONSOLE RUBELLA ANTIBODY, IGM: Rubella: IMMUNE

## 2021-04-15 LAB — OB RESULTS CONSOLE GC/CHLAMYDIA
Chlamydia: NEGATIVE
Gonorrhea: NEGATIVE

## 2021-04-15 LAB — HEPATITIS C ANTIBODY: HCV Ab: NEGATIVE

## 2021-04-15 LAB — OB RESULTS CONSOLE HEPATITIS B SURFACE ANTIGEN: Hepatitis B Surface Ag: NEGATIVE

## 2021-04-15 LAB — OB RESULTS CONSOLE ANTIBODY SCREEN: Antibody Screen: NEGATIVE

## 2021-04-15 LAB — OB RESULTS CONSOLE RPR: RPR: NONREACTIVE

## 2021-08-24 NOTE — L&D Delivery Note (Signed)
Delivery Note ? ? ?Pt progressed rapidly to complete dilation and pushed about 5 minutes.  At 4:47 PM a healthy female was delivered via Vaginal, Spontaneous (Presentation: Right Occiput Anterior).  APGAR: 8, 8; weight pending .   ?Placenta status: Spontaneous, Intact.  Cord: 3 vessels with the following complications: Nuchal cord delivered through.   ? ?Anesthesia: Epidural ?Episiotomy: None ?Lacerations: None ? ?Est. Blood Loss (mL): ? ?Mom to postpartum.  Baby to Couplet care / Skin to Skin. ? ? ? ?Renee Matthews ?11/20/2021, 5:16 PM ? ? ? ?

## 2021-10-31 LAB — OB RESULTS CONSOLE GBS: GBS: NEGATIVE

## 2021-11-06 ENCOUNTER — Telehealth (HOSPITAL_COMMUNITY): Payer: Self-pay | Admitting: *Deleted

## 2021-11-06 ENCOUNTER — Encounter (HOSPITAL_COMMUNITY): Payer: Self-pay | Admitting: *Deleted

## 2021-11-06 NOTE — Telephone Encounter (Signed)
Preadmission screen  

## 2021-11-07 ENCOUNTER — Telehealth (HOSPITAL_COMMUNITY): Payer: Self-pay | Admitting: *Deleted

## 2021-11-07 NOTE — Telephone Encounter (Signed)
Preadmission screen  

## 2021-11-10 ENCOUNTER — Telehealth (HOSPITAL_COMMUNITY): Payer: Self-pay | Admitting: *Deleted

## 2021-11-10 ENCOUNTER — Encounter (HOSPITAL_COMMUNITY): Payer: Self-pay | Admitting: *Deleted

## 2021-11-10 NOTE — Telephone Encounter (Signed)
Preadmission screen  

## 2021-11-16 IMAGING — US US ABDOMEN LIMITED
1 series · 14 of 25 positions shown · non-contrast
Comparison: None.

CLINICAL DATA: Right upper quadrant pain

EXAM:
ULTRASOUND ABDOMEN LIMITED RIGHT UPPER QUADRANT

[Series 1: us abdomen limited · 14 of 39 slices shown]
[im 1/39]
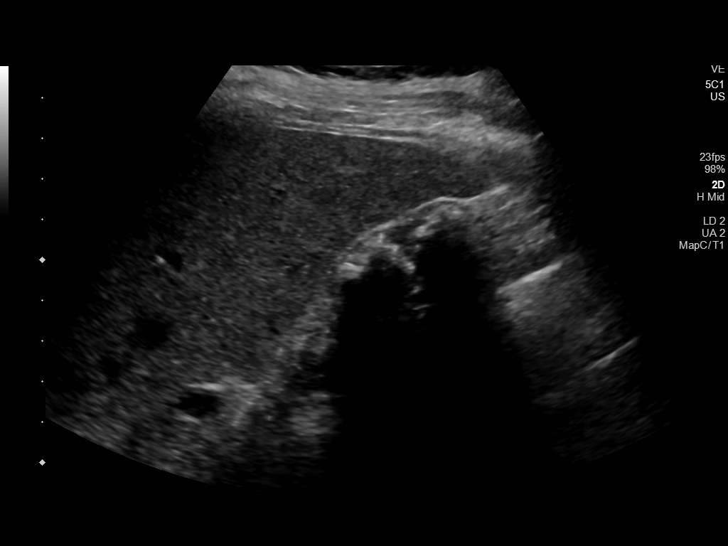
[im 4/39]
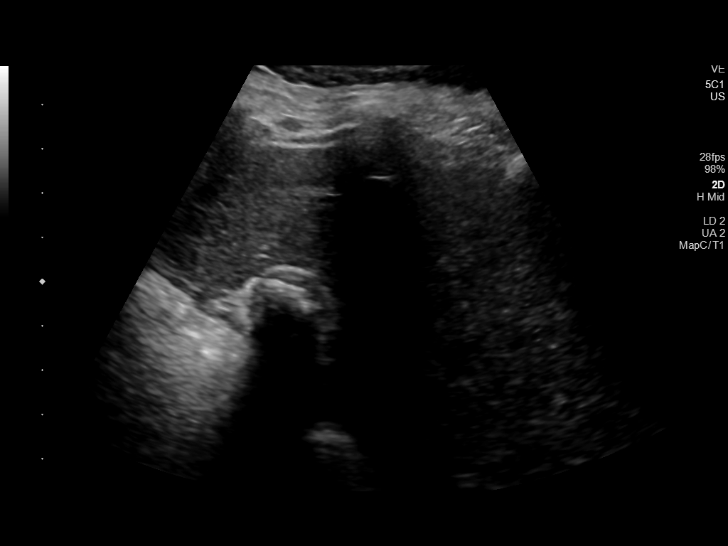
[im 7/39]
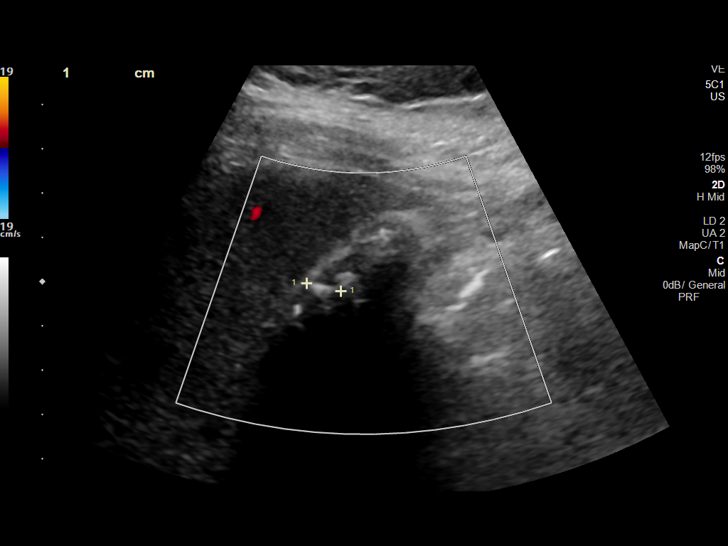
[im 10/39]
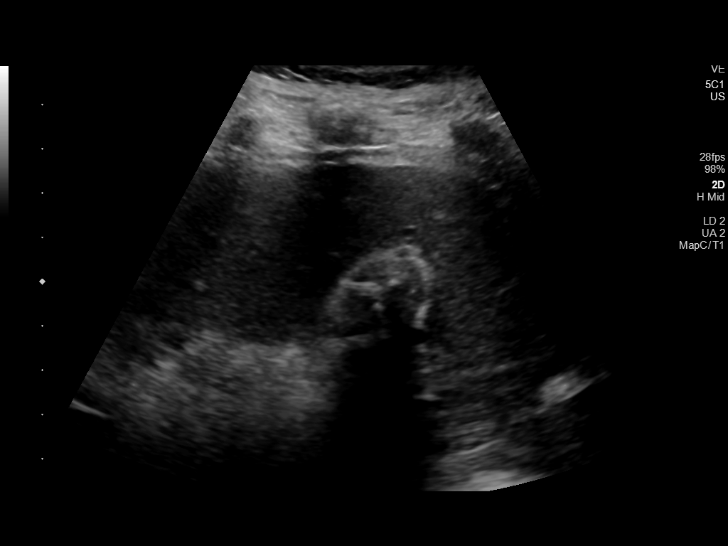
[im 13/39]
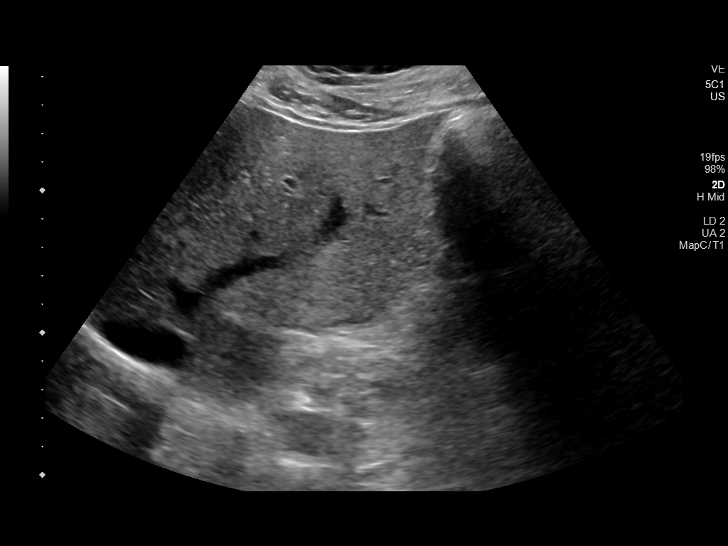
[im 15/39]
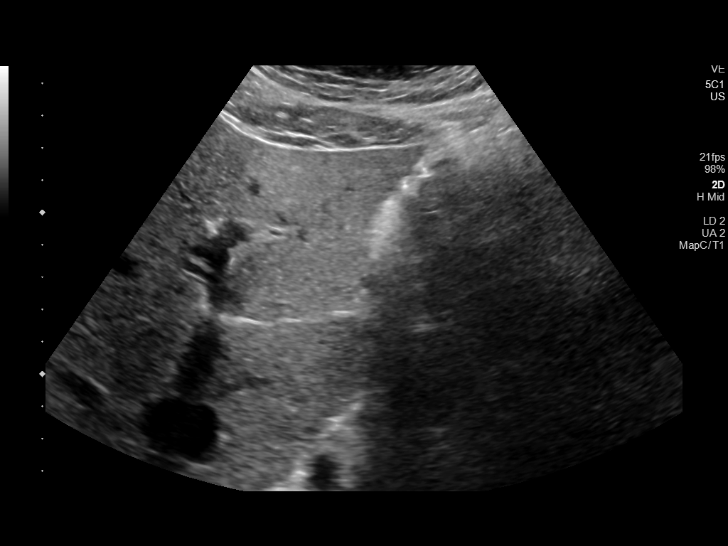
[im 18/39]
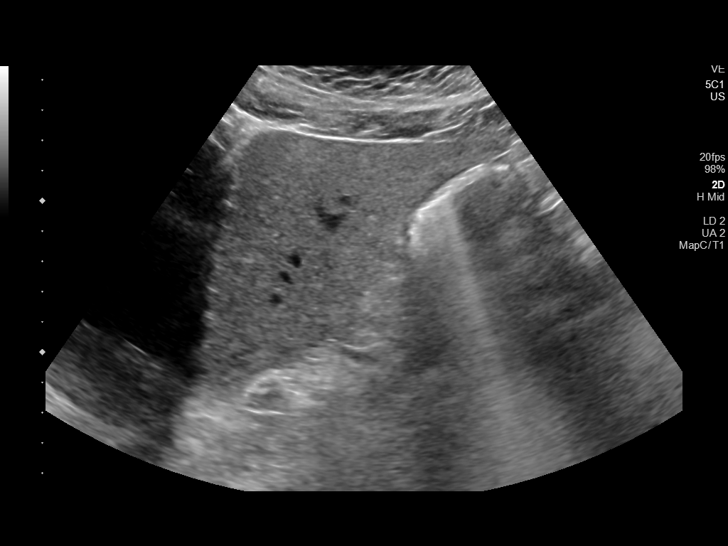
[im 21/39]
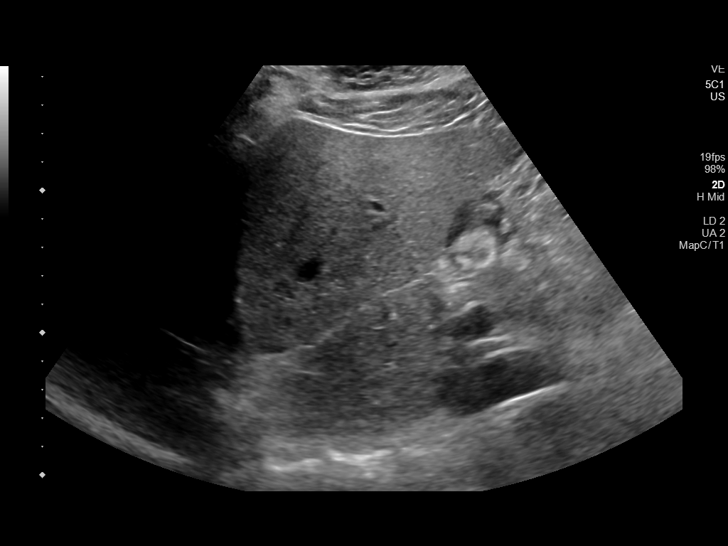
[im 24/39]
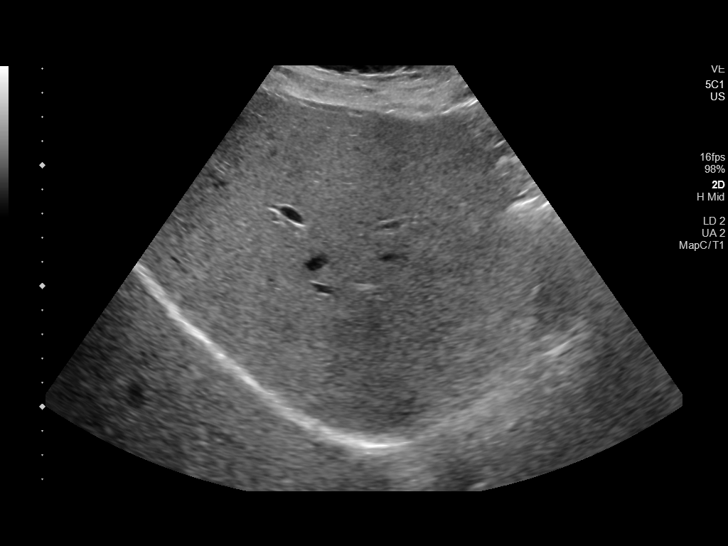
[im 26/39]
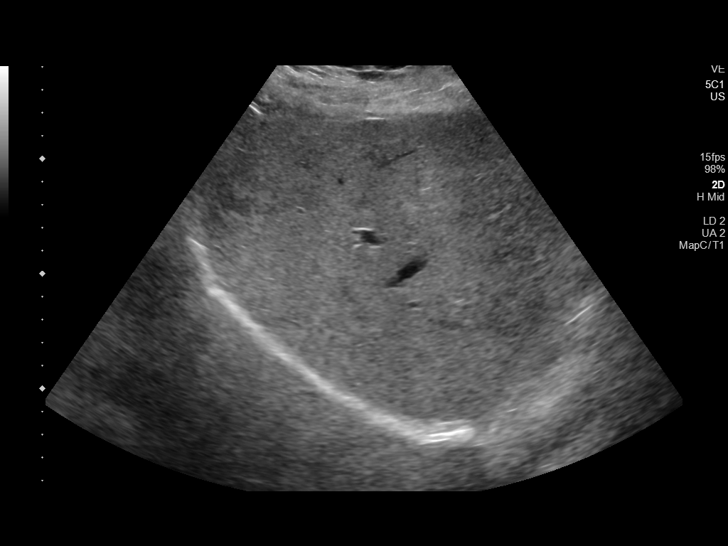
[im 29/39]
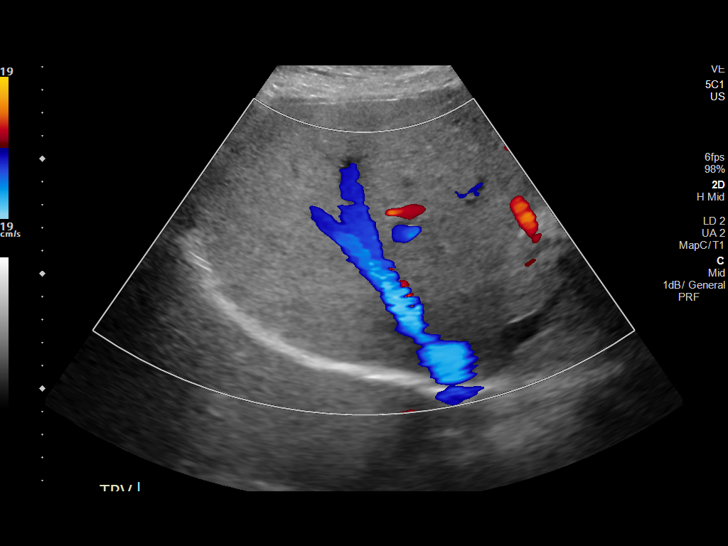
[im 32/39]
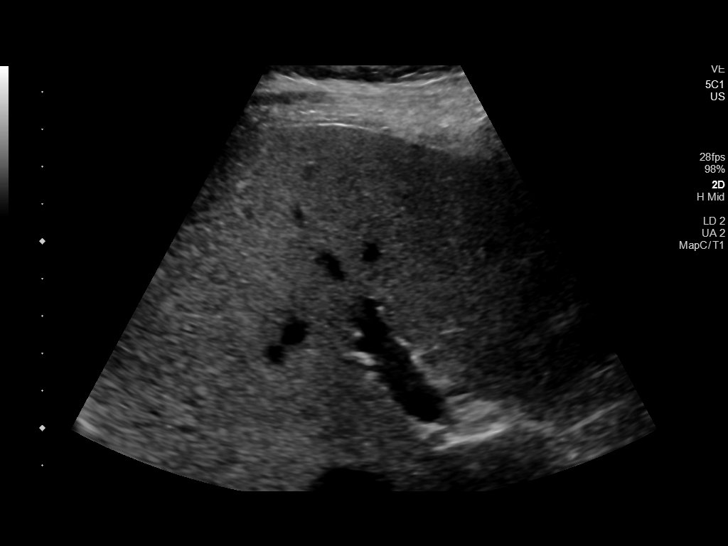
[im 35/39]
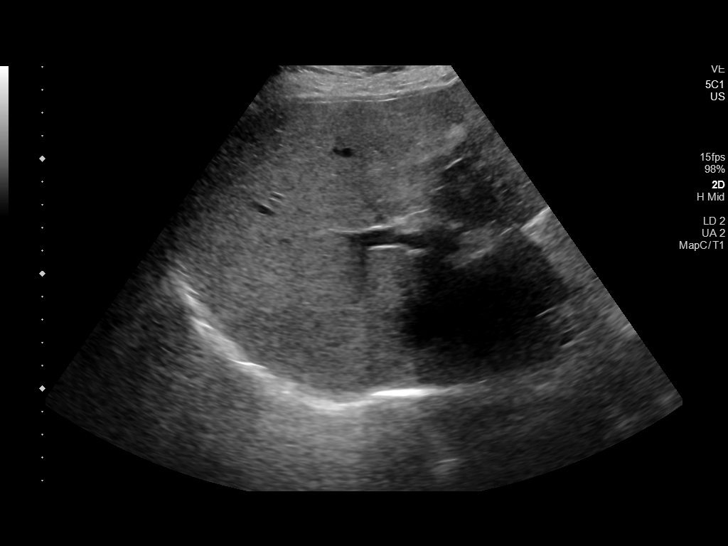
[im 39/39]
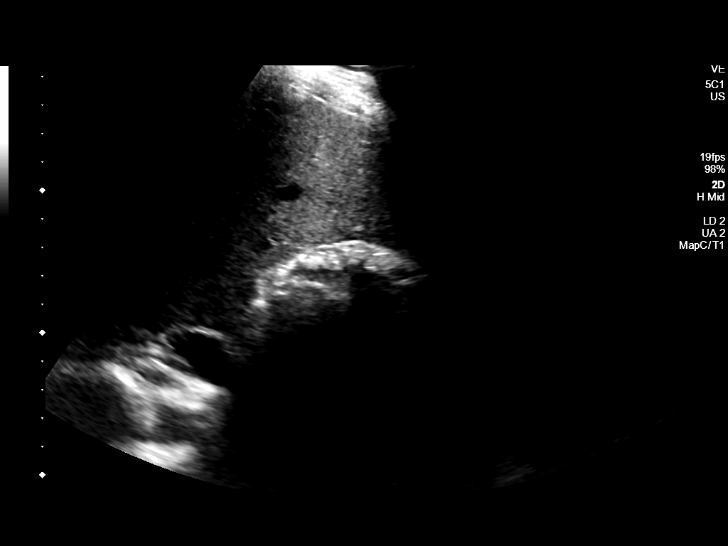

[14 of 25 positions shown; findings below may reference images not displayed]

FINDINGS: Gallbladder:

Gallbladder is somewhat contracted and largely filled with
gallstones. Largest individual gallstone measures 8 mm in length.
There is no appreciable gallbladder wall thickening or
pericholecystic fluid. No sonographic Murphy sign noted by
sonographer.

Common bile duct:

Diameter: 4 mm. No intrahepatic or extrahepatic biliary duct
dilatation.

Liver:

No focal lesion identified. Liver echogenicity overall is increased.
Portal vein is patent on color Doppler imaging with normal direction
of blood flow towards the liver.

Other: None.
IMPRESSION: 1. Gallbladder somewhat contracted in largely filled with
gallstones.

2. Increase in liver echogenicity, a finding indicative of hepatic
steatosis. No appreciable focal liver lesions evident.

## 2021-11-19 ENCOUNTER — Other Ambulatory Visit: Payer: Self-pay | Admitting: Obstetrics and Gynecology

## 2021-11-19 NOTE — H&P (Signed)
Renee Matthews is a 26 y.o. female [id number] at 25 5/7 weeks (EDD 11/22/21 by 8 week [place] inconsistent with LMP) presenting for IOL at term. ? ?Prenatal care significant for: ?1)Anxiety disorder  ?used zoloft in past, stable off medsfor pregnancy but will resume postpartum ?2) Asthma  ?no inhaler needed in years ?3) Varicella non-immune  ?vaccinate pp ? ?OB History   ? ? Gravida  ?4  ? Para  ?3  ? Term  ?3  ? Preterm  ?   ? AB  ?   ? Living  ?3  ?  ? ? SAB  ?   ? IAB  ?   ? Ectopic  ?   ? Multiple  ?0  ? Live Births  ?3  ?   ?  ?  ?01-26-2011, 40 wks ?M, 6lbs 10oz, Vaginal Delivery ? ?03-04-2017, 38.3 wks ?6lbs 11oz, Vaginal Delivery ? ?12-22-2018, 39 wks ?M, 7lbs 10oz ? ?Past Medical History:  ?Diagnosis Date  ? Anxiety   ? Asthma   ? Gallstones   ? Postpartum hemorrhage   ? ROM (rupture of membranes), premature 03/04/2017  ? SVD (spontaneous vaginal delivery) 12/22/2018  ? ?Past Surgical History:  ?Procedure Laterality Date  ? WISDOM TOOTH EXTRACTION    ? ?Family History: family history includes Cancer in her maternal grandfather; Diabetes in her maternal grandmother; Heart disease in her maternal grandmother; Hypertension in her maternal aunt. ?Social History:  reports that she has never smoked. She has never used smokeless tobacco. She reports that she does not drink alcohol and does not use drugs. ? ? ?  ?Maternal Diabetes: No ?Genetic Screening: Normal ?Maternal Ultrasounds/Referrals: Normal ?Fetal Ultrasounds or other Referrals:  None ?Maternal Substance Abuse:  No ?Significant Maternal Medications:  None ?Significant Maternal Lab Results:  Group B Strep negative ?Other Comments:  None ? ?Review of Systems ?History ?  ?unknown if currently breastfeeding. ?Exam ?Physical Exam  ?Prenatal labs: ?ABO, Rh: B/Positive/-- (08/23 0000) ?Antibody: Negative (08/23 0000) ?Rubella: Immune (08/23 0000) ?RPR: Nonreactive (08/23 0000)  ?HBsAg: Negative (08/23 0000)  ?HIV: Non-reactive (08/23 0000)  ?GBS: Negative/-- (03/10 0000)   ?NIPT low risk ?Essential carrier screen negative x 3 in prior pregnancy ?One hour GCT 127 ? ?Assessment/Plan: ?Pt for IOL at term.  Plan pitocin and AROM ?Epidural prn  ? ?Oliver Pila ?11/19/2021, 10:02 PM ? ? ? ? ?

## 2021-11-19 NOTE — H&P (View-Only) (Signed)
Renee Matthews is a 25 y.o. female G4P3003 at 39 5/7 weeks (EDD 11/22/21 by 8 week [place] inconsistent with LMP) presenting for IOL at term. ? ?Prenatal care significant for: ?1)Anxiety disorder  ?used zoloft in past, stable off medsfor pregnancy but will resume postpartum ?2) Asthma  ?no inhaler needed in years ?3) Varicella non-immune  ?vaccinate pp ? ?OB History   ? ? Gravida  ?4  ? Para  ?3  ? Term  ?3  ? Preterm  ?   ? AB  ?   ? Living  ?3  ?  ? ? SAB  ?   ? IAB  ?   ? Ectopic  ?   ? Multiple  ?0  ? Live Births  ?3  ?   ?  ?  ?01-26-2011, 40 wks ?M, 6lbs 10oz, Vaginal Delivery ? ?03-04-2017, 38.3 wks ?6lbs 11oz, Vaginal Delivery ? ?12-22-2018, 39 wks ?M, 7lbs 10oz ? ?Past Medical History:  ?Diagnosis Date  ? Anxiety   ? Asthma   ? Gallstones   ? Postpartum hemorrhage   ? ROM (rupture of membranes), premature 03/04/2017  ? SVD (spontaneous vaginal delivery) 12/22/2018  ? ?Past Surgical History:  ?Procedure Laterality Date  ? WISDOM TOOTH EXTRACTION    ? ?Family History: family history includes Cancer in her maternal grandfather; Diabetes in her maternal grandmother; Heart disease in her maternal grandmother; Hypertension in her maternal aunt. ?Social History:  reports that she has never smoked. She has never used smokeless tobacco. She reports that she does not drink alcohol and does not use drugs. ? ? ?  ?Maternal Diabetes: No ?Genetic Screening: Normal ?Maternal Ultrasounds/Referrals: Normal ?Fetal Ultrasounds or other Referrals:  None ?Maternal Substance Abuse:  No ?Significant Maternal Medications:  None ?Significant Maternal Lab Results:  Group B Strep negative ?Other Comments:  None ? ?Review of Systems ?History ?  ?unknown if currently breastfeeding. ?Exam ?Physical Exam  ?Prenatal labs: ?ABO, Rh: B/Positive/-- (08/23 0000) ?Antibody: Negative (08/23 0000) ?Rubella: Immune (08/23 0000) ?RPR: Nonreactive (08/23 0000)  ?HBsAg: Negative (08/23 0000)  ?HIV: Non-reactive (08/23 0000)  ?GBS: Negative/-- (03/10 0000)   ?NIPT low risk ?Essential carrier screen negative x 3 in prior pregnancy ?One hour GCT 127 ? ?Assessment/Plan: ?Pt for IOL at term.  Plan pitocin and AROM ?Epidural prn  ? ?Swara Donze W Dodie Parisi ?11/19/2021, 10:02 PM ? ? ? ? ?

## 2021-11-20 ENCOUNTER — Other Ambulatory Visit: Payer: Self-pay

## 2021-11-20 ENCOUNTER — Inpatient Hospital Stay (HOSPITAL_COMMUNITY): Payer: Medicaid Other | Admitting: Anesthesiology

## 2021-11-20 ENCOUNTER — Inpatient Hospital Stay (HOSPITAL_COMMUNITY): Payer: Medicaid Other

## 2021-11-20 ENCOUNTER — Encounter (HOSPITAL_COMMUNITY): Payer: Self-pay | Admitting: Obstetrics and Gynecology

## 2021-11-20 ENCOUNTER — Inpatient Hospital Stay (HOSPITAL_COMMUNITY)
Admission: AD | Admit: 2021-11-20 | Discharge: 2021-11-22 | DRG: 807 | Disposition: A | Discharge status: Home / Self Care | Payer: Medicaid Other | Attending: Obstetrics and Gynecology | Admitting: Obstetrics and Gynecology

## 2021-11-20 DIAGNOSIS — Z3A39 39 weeks gestation of pregnancy: Secondary | ICD-10-CM | POA: Diagnosis not present

## 2021-11-20 DIAGNOSIS — O26893 Other specified pregnancy related conditions, third trimester: Secondary | ICD-10-CM | POA: Diagnosis present

## 2021-11-20 LAB — CBC
HCT: 34.2 % — ABNORMAL LOW (ref 36.0–46.0)
Hemoglobin: 11.2 g/dL — ABNORMAL LOW (ref 12.0–15.0)
MCH: 27.1 pg (ref 26.0–34.0)
MCHC: 32.7 g/dL (ref 30.0–36.0)
MCV: 82.6 fL (ref 80.0–100.0)
Platelets: 311 10*3/uL (ref 150–400)
RBC: 4.14 MIL/uL (ref 3.87–5.11)
RDW: 14.3 % (ref 11.5–15.5)
WBC: 10.5 10*3/uL (ref 4.0–10.5)
nRBC: 0 % (ref 0.0–0.2)

## 2021-11-20 LAB — TYPE AND SCREEN
ABO/RH(D): B POS
Antibody Screen: NEGATIVE

## 2021-11-20 MED ORDER — ONDANSETRON HCL 4 MG PO TABS: 4.0000 mg | ORAL_TABLET | ORAL | Status: DC | PRN

## 2021-11-20 MED ORDER — SENNOSIDES-DOCUSATE SODIUM 8.6-50 MG PO TABS
2.0000 | ORAL_TABLET | ORAL | Status: DC
  Administered 2021-11-20: 2 via ORAL
  Filled 2021-11-20 (×2): qty 2

## 2021-11-20 MED ORDER — OXYTOCIN-SODIUM CHLORIDE 30-0.9 UT/500ML-% IV SOLN: 2.5000 [IU]/h | INTRAVENOUS | Status: DC

## 2021-11-20 MED ORDER — SIMETHICONE 80 MG PO CHEW: 80.0000 mg | CHEWABLE_TABLET | ORAL | Status: DC | PRN

## 2021-11-20 MED ORDER — FENTANYL-BUPIVACAINE-NACL 0.5-0.125-0.9 MG/250ML-% EP SOLN
12.0000 mL/h | EPIDURAL | Status: DC | PRN
  Filled 2021-11-20: qty 250

## 2021-11-20 MED ORDER — PHENYLEPHRINE 40 MCG/ML (10ML) SYRINGE FOR IV PUSH (FOR BLOOD PRESSURE SUPPORT): 80.0000 ug | PREFILLED_SYRINGE | INTRAVENOUS | Status: DC | PRN

## 2021-11-20 MED ORDER — TETANUS-DIPHTH-ACELL PERTUSSIS 5-2.5-18.5 LF-MCG/0.5 IM SUSY: 0.5000 mL | PREFILLED_SYRINGE | Freq: Once | INTRAMUSCULAR | Status: DC

## 2021-11-20 MED ORDER — EPHEDRINE 5 MG/ML INJ: 10.0000 mg | INTRAVENOUS | Status: DC | PRN

## 2021-11-20 MED ORDER — ACETAMINOPHEN 325 MG PO TABS
650.0000 mg | ORAL_TABLET | ORAL | Status: DC | PRN
  Administered 2021-11-21 – 2021-11-22 (×3): 650 mg via ORAL
  Filled 2021-11-20 (×3): qty 2

## 2021-11-20 MED ORDER — ONDANSETRON HCL 4 MG/2ML IJ SOLN: 4.0000 mg | INTRAMUSCULAR | Status: DC | PRN

## 2021-11-20 MED ORDER — ACETAMINOPHEN 325 MG PO TABS: 650.0000 mg | ORAL_TABLET | ORAL | Status: DC | PRN

## 2021-11-20 MED ORDER — COCONUT OIL OIL
1.0000 "application " | TOPICAL_OIL | Status: DC | PRN
  Administered 2021-11-20: 1 via TOPICAL

## 2021-11-20 MED ORDER — OXYCODONE-ACETAMINOPHEN 5-325 MG PO TABS: 2.0000 | ORAL_TABLET | ORAL | Status: DC | PRN

## 2021-11-20 MED ORDER — IBUPROFEN 600 MG PO TABS
600.0000 mg | ORAL_TABLET | Freq: Four times a day (QID) | ORAL | Status: DC
  Administered 2021-11-20 – 2021-11-22 (×6): 600 mg via ORAL
  Filled 2021-11-20 (×7): qty 1

## 2021-11-20 MED ORDER — BENZOCAINE-MENTHOL 20-0.5 % EX AERO
1.0000 "application " | INHALATION_SPRAY | CUTANEOUS | Status: DC | PRN
  Administered 2021-11-20: 1 via TOPICAL
  Filled 2021-11-20: qty 56

## 2021-11-20 MED ORDER — SERTRALINE HCL 50 MG PO TABS
50.0000 mg | ORAL_TABLET | Freq: Every day | ORAL | Status: DC
  Administered 2021-11-20 – 2021-11-21 (×2): 50 mg via ORAL
  Filled 2021-11-20 (×2): qty 1

## 2021-11-20 MED ORDER — DIBUCAINE (PERIANAL) 1 % EX OINT: 1.0000 "application " | TOPICAL_OINTMENT | CUTANEOUS | Status: DC | PRN

## 2021-11-20 MED ORDER — SOD CITRATE-CITRIC ACID 500-334 MG/5ML PO SOLN: 30.0000 mL | ORAL | Status: DC | PRN

## 2021-11-20 MED ORDER — PRENATAL MULTIVITAMIN CH
1.0000 | ORAL_TABLET | Freq: Every day | ORAL | Status: DC
  Administered 2021-11-21 – 2021-11-22 (×2): 1 via ORAL
  Filled 2021-11-20 (×2): qty 1

## 2021-11-20 MED ORDER — LACTATED RINGERS IV SOLN: INTRAVENOUS | Status: DC

## 2021-11-20 MED ORDER — FENTANYL CITRATE (PF) 100 MCG/2ML IJ SOLN: 50.0000 ug | INTRAMUSCULAR | Status: DC | PRN

## 2021-11-20 MED ORDER — ONDANSETRON HCL 4 MG/2ML IJ SOLN: 4.0000 mg | Freq: Four times a day (QID) | INTRAMUSCULAR | Status: DC | PRN

## 2021-11-20 MED ORDER — TERBUTALINE SULFATE 1 MG/ML IJ SOLN: 0.2500 mg | Freq: Once | INTRAMUSCULAR | Status: DC | PRN

## 2021-11-20 MED ORDER — WITCH HAZEL-GLYCERIN EX PADS: 1.0000 "application " | MEDICATED_PAD | CUTANEOUS | Status: DC | PRN

## 2021-11-20 MED ORDER — OXYCODONE-ACETAMINOPHEN 5-325 MG PO TABS: 1.0000 | ORAL_TABLET | ORAL | Status: DC | PRN

## 2021-11-20 MED ORDER — LIDOCAINE HCL (PF) 1 % IJ SOLN: 30.0000 mL | INTRAMUSCULAR | Status: DC | PRN

## 2021-11-20 MED ORDER — OXYTOCIN-SODIUM CHLORIDE 30-0.9 UT/500ML-% IV SOLN
INTRAVENOUS | Status: AC
  Filled 2021-11-20: qty 500

## 2021-11-20 MED ORDER — LIDOCAINE HCL (PF) 1 % IJ SOLN
INTRAMUSCULAR | Status: DC | PRN
Start: 2021-11-20 — End: 2021-11-20
  Administered 2021-11-20: 2 mL via EPIDURAL
  Administered 2021-11-20: 10 mL via EPIDURAL

## 2021-11-20 MED ORDER — OXYTOCIN-SODIUM CHLORIDE 30-0.9 UT/500ML-% IV SOLN
1.0000 m[IU]/min | INTRAVENOUS | Status: DC
  Administered 2021-11-20: 2 m[IU]/min via INTRAVENOUS

## 2021-11-20 MED ORDER — DIPHENHYDRAMINE HCL 50 MG/ML IJ SOLN: 12.5000 mg | INTRAMUSCULAR | Status: DC | PRN

## 2021-11-20 MED ORDER — OXYTOCIN BOLUS FROM INFUSION
333.0000 mL | Freq: Once | INTRAVENOUS | Status: AC
  Administered 2021-11-20: 333 mL via INTRAVENOUS

## 2021-11-20 MED ORDER — ZOLPIDEM TARTRATE 5 MG PO TABS: 5.0000 mg | ORAL_TABLET | Freq: Every evening | ORAL | Status: DC | PRN

## 2021-11-20 MED ORDER — LACTATED RINGERS IV SOLN: 500.0000 mL | INTRAVENOUS | Status: DC | PRN

## 2021-11-20 MED ORDER — LACTATED RINGERS IV SOLN: 500.0000 mL | Freq: Once | INTRAVENOUS | Status: DC

## 2021-11-20 MED ORDER — DIPHENHYDRAMINE HCL 25 MG PO CAPS: 25.0000 mg | ORAL_CAPSULE | Freq: Four times a day (QID) | ORAL | Status: DC | PRN

## 2021-11-20 NOTE — Lactation Note (Incomplete)
This note was copied from a baby's chart. ?Lactation Consultation Note ? ?Patient Name: Renee Matthews ?Today's Date: 11/20/2021 ?Reason for consult: Initial assessment;Term ?Age:28 hours ? 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 Mom breastfed her other children for 2+ years. She knows how to hand express and had to do so with her 2nd son due to latch issues. Mom has spoons. Reviewed hand expression technique. Mom mentioned she usually gets cracked, sore, bleeding nipples during her hospital stay and asked if she could have coconut oil when that happens - confirmed. Mom is a [facility] participant and has been for her previous pregnancies. She did have a personal pump as well but she sold it because she thought she was done.  ? ?Reviewed expected input and output, normal newborn behavior, and feeding frequency.  ? ?Plan: ?1) Breastfeed baby on cue 8-12x in a 24 hour period. ?2) Prioritize skin to skin and maternal rest, hydration, and nutrition. ?3) Follow up tomorrow and call for feeding assist as needed.  ? ?Maternal Data ?Has patient been taught Hand Expression?: Yes ?Does the patient have breastfeeding experience prior to this delivery?: Yes ?How long did the patient breastfeed?: 2 years each time. ? ?Feeding ?Mother's Current Feeding Choice: Breast Milk ?  ?Discharge ?Pump: Advised to call insurance company ?WIC Program: Yes ? ?Consult Status ?Consult Status: Follow-up ?Date: 11/21/21 ?Follow-up type: In-patient ? ?Antionette Char ?11/20/2021, 8:35 PM ? ? ? ?

## 2021-11-20 NOTE — Lactation Note (Signed)
This note was copied from a baby's chart. ?Lactation Consultation Note ? ?Patient Name: Renee Matthews ?Today's Date: 11/20/2021 ?Reason for consult: L&D Initial assessment;Term;Breastfeeding assistance;Other (Comment) (LC LD visit at 45 mins PP. Baby latched on the Rt Br when LC arrived with depth. per mom had already fed on the Lt Br 10 mins. Exp BF) ?Age: 28 mins PP  ? ? ? ?Maternal Data ?  ? ?Feeding ?Mother's Current Feeding Choice: Breast Milk ? ?LATCH Score ?Latch:  (latched with depth,) ? ?Audible Swallowing:  (no swallows) ? ?Type of Nipple:  (erect nipple) ? ?Comfort (Breast/Nipple):  (per mom comfortable) ? ?Hold (Positioning):  (mom independent with latch) ? ?  ? ? ?Lactation Tools Discussed/Used ?  ? ?Interventions ?Interventions: Breast feeding basics reviewed;Education ? ?Discharge ?  ? ?Consult Status ?Consult Status: Follow-up from L&D ?Date: 11/20/21 ?Follow-up type: In-patient ? ? ? ?Matilde Sprang Mikale Silversmith ?11/20/2021, 5:47 PM ? ? ? ?

## 2021-11-20 NOTE — Anesthesia Procedure Notes (Signed)
Epidural ?Patient location during procedure: OB ?Start time: 11/20/2021 2:10 PM ?End time: 11/20/2021 2:19 PM ? ?Staffing ?Anesthesiologist: Pervis Hocking, DO ?Performed: anesthesiologist  ? ?Preanesthetic Checklist ?Completed: patient identified, IV checked, risks and benefits discussed, monitors and equipment checked, pre-op evaluation and timeout performed ? ?Epidural ?Patient position: sitting ?Prep: DuraPrep and site prepped and draped ?Patient monitoring: continuous pulse ox, blood pressure, heart rate and cardiac monitor ?Approach: midline ?Location: L3-L4 ?Injection technique: LOR air ? ?Needle:  ?Needle type: Tuohy  ?Needle gauge: 17 G ?Needle length: 9 cm ?Needle insertion depth: 6 cm ?Catheter type: closed end flexible ?Catheter size: 19 Gauge ?Catheter at skin depth: 11 cm ?Test dose: negative ? ?Assessment ?Sensory level: T8 ?Events: blood not aspirated, injection not painful, no injection resistance, no paresthesia and negative IV test ? ?Additional Notes ?Patient identified. Risks/Benefits/Options discussed with patient including but not limited to bleeding, infection, nerve damage, paralysis, failed block, incomplete pain control, headache, blood pressure changes, nausea, vomiting, reactions to medication both or allergic, itching and postpartum back pain. Confirmed with bedside nurse the patient's most recent platelet count. Confirmed with patient that they are not currently taking any anticoagulation, have any bleeding history or any family history of bleeding disorders. Patient expressed understanding and wished to proceed. All questions were answered. Sterile technique was used throughout the entire procedure. Please see nursing notes for vital signs. Test dose was given through epidural catheter and negative prior to continuing to dose epidural or start infusion. Warning signs of high block given to the patient including shortness of breath, tingling/numbness in hands, complete motor  block, or any concerning symptoms with instructions to call for help. Patient was given instructions on fall risk and not to get out of bed. All questions and concerns addressed with instructions to call with any issues or inadequate analgesia.  Reason for block:procedure for pain ? ? ? ?

## 2021-11-20 NOTE — Lactation Note (Signed)
This note was copied from a baby's chart. ?Lactation Consultation Note ?  ?Patient Name: Renee Matthews ?Today's Date: 11/20/2021 ?Reason for consult: Initial assessment;Term ?Age:28 hours ?  ?[employer] student in to see P4 mom of a 49 hour old term female infant. Mom breastfed her other children for 2+ years. She knows how to hand express and had to do so with her 2nd son due to latch issues. Mom has spoons. Reviewed hand expression technique. Mom mentioned she usually gets cracked, sore, bleeding nipples during her hospital stay and asked if she could have coconut oil when that happens - confirmed. Mom is a [facility] participant and has been for her previous pregnancies. She did have a personal pump as well but she sold it because she thought she was done.  ?  ?Reviewed expected input and output, normal newborn behavior, and feeding frequency.  ?  ?Plan: ?1) Breastfeed baby on cue 8-12x in a 24 hour period. ?2) Prioritize skin to skin and maternal rest, hydration, and nutrition. ?3) Follow up tomorrow and call for feeding assist as needed.  ?  ?Maternal Data ?Has patient been taught Hand Expression?: Yes ?Does the patient have breastfeeding experience prior to this delivery?: Yes ?How long did the patient breastfeed?: 2 years each time. ?  ?Feeding ?Mother's Current Feeding Choice: Breast Milk ?Discharge ?Pump: Advised to call insurance company ?[place] Program: Yes ?  ?Consult Status ?Consult Status: Follow-up ?Date: 11/21/21 ?Follow-up type: In-patient ?  ?Helen student ?11/20/2021, 8:35 PM ? ?I concur with Lactation Note written by [facility] student.  ?Glendon Dunwoody A Higuera Ancidey ?11/20/2021, 9:17 PM ? ? ? ?

## 2021-11-20 NOTE — Progress Notes (Signed)
Patient ID: Renee Matthews, female   DOB: [birth year], 26 y.o.   MRN: [id number] ?Pt getting more uncomfortable ? ?Afeb VSS ?FHR category 1 ? ?Cervix 60/3-4/-2 ?AROM clear ? ?Epidural when desires ?

## 2021-11-20 NOTE — Anesthesia Preprocedure Evaluation (Signed)
Anesthesia Evaluation  ?Patient identified by MRN, date of birth, ID band ?Patient awake ? ? ? ?Reviewed: ?Allergy & Precautions, Patient's Chart, lab work & pertinent test results ? ?Airway ?Mallampati: II ? ?TM Distance: >3 FB ?Neck ROM: Full ? ? ? Dental ?no notable dental hx. ? ?  ?Pulmonary ?asthma (well controlled) ,  ?  ?Pulmonary exam normal ?breath sounds clear to auscultation ? ? ? ? ? ? Cardiovascular ?negative cardio ROS ?Normal cardiovascular exam ?Rhythm:Regular Rate:Normal ? ? ?  ?Neuro/Psych ?PSYCHIATRIC DISORDERS Anxiety negative neurological ROS ?   ? GI/Hepatic ?negative GI ROS, Neg liver ROS,   ?Endo/Other  ?BMI 37 ? Renal/GU ?negative Renal ROS  ?negative genitourinary ?  ?Musculoskeletal ?negative musculoskeletal ROS ?(+)  ? Abdominal ?  ?Peds ? Hematology ?negative hematology ROS ?(+) hct 34.2, plt 311   ?Anesthesia Other Findings ? ? Reproductive/Obstetrics ?(+) Pregnancy ? ?  ? ? ? ? ? ? ? ? ? ? ? ? ? ?  ?  ? ? ? ? ? ? ? ? ?Anesthesia Physical ?Anesthesia Plan ? ?ASA: 2 ? ?Anesthesia Plan: Epidural  ? ?Post-op Pain Management:   ? ?Induction:  ? ?PONV Risk Score and Plan: 2 ? ?Airway Management Planned: Natural Airway ? ?Additional Equipment: None ? ?Intra-op Plan:  ? ?Post-operative Plan:  ? ?Informed Consent: I have reviewed the patients History and Physical, chart, labs and discussed the procedure including the risks, benefits and alternatives for the proposed anesthesia with the patient or authorized representative who has indicated his/her understanding and acceptance.  ? ? ? ? ? ?Plan Discussed with:  ? ?Anesthesia Plan Comments:   ? ? ? ? ? ? ?Anesthesia Quick Evaluation ? ?

## 2021-11-20 NOTE — Interval H&P Note (Signed)
History and Physical Interval Note: ? ?11/20/2021 ?12:18 PM ? ?Pt admitted and starting on pitocin.  Afeb VSS and FHR category 1 ?Cervix 3/50/-3 ? ?Will recheck for AROM in 2 hours ? ? ?Renee Matthews ? ? ?

## 2021-11-21 LAB — CBC
HCT: 33 % — ABNORMAL LOW (ref 36.0–46.0)
Hemoglobin: 10.6 g/dL — ABNORMAL LOW (ref 12.0–15.0)
MCH: 26.6 pg (ref 26.0–34.0)
MCHC: 32.1 g/dL (ref 30.0–36.0)
MCV: 82.7 fL (ref 80.0–100.0)
Platelets: 306 10*3/uL (ref 150–400)
RBC: 3.99 MIL/uL (ref 3.87–5.11)
RDW: 14.1 % (ref 11.5–15.5)
WBC: 13.9 10*3/uL — ABNORMAL HIGH (ref 4.0–10.5)
nRBC: 0 % (ref 0.0–0.2)

## 2021-11-21 LAB — RPR: RPR Ser Ql: NONREACTIVE

## 2021-11-21 LAB — BIRTH TISSUE RECOVERY COLLECTION (PLACENTA DONATION)

## 2021-11-21 MED ORDER — IBUPROFEN 600 MG PO TABS: 600.0000 mg | ORAL_TABLET | Freq: Four times a day (QID) | ORAL | 0 refills | Status: AC

## 2021-11-21 NOTE — Progress Notes (Signed)
Decided she and baby were not ready to go home today, discharge canceled ?

## 2021-11-21 NOTE — Progress Notes (Signed)
PPD #1 ?No problems, would like to go home this pm if baby ok to go ?Afeb, VSS ?Fundus firm, NT at U-1 ?Continue routine postpartum care, d/c home this pm if baby ok to go ?

## 2021-11-21 NOTE — Social Work (Addendum)
CSW received consult for hx of Anxiety.  CSW met with MOB to offer support and complete assessment.   ? ?CSW met with MOB at bedside and introduced CSW role. CSW observed MOB holding the infant and FOB was out of the room. MOB presented pleasant and welcomed CSW visit. CSW inquired how MOB has felt since giving birth. MOB reported feeling "pretty good, my mood drops a little and I feel sad but then I am fine again." MOB reported that she started taking the medication Sertraline last night to help manage her anxiety and will continue to take the medication postpartum. MOB reported the medication is very helpful. MOB shared she was diagnosed with postpartum anxiety in 2019 and felt constantly overwhelmed and nervous. MOB reported she was prescribed Sertraline at the time which helped. CSW inquired about MOB coping strategies. MOB expressed she "enjoys playing with her boys." CSW acknowledged MOB efforts. CSW inquired about MOB supports. MOB identified her spouse as a support. CSW assessed MOB for safety. MOB denied thoughts of harm to self and others.  ? ?CSW provided education regarding the baby blues period vs. perinatal mood disorders, discussed treatment and gave resources for mental health follow up if concerns arise.  CSW recommended MOB complete a self-evaluation during the postpartum time period using the New Mom Checklist from Postpartum Progress and encouraged MOB to contact a medical professional if symptoms are noted at any time.   ? ?CSW provided review of Sudden Infant Death Syndrome (SIDS) precautions. MOB reported she has essential items for the infant including a bassinet where the infant will sleep. MOB has chosen [facility] for the infant's follow up care. CSW assessed MOB for additional needs. MOB reported no further need.  ? ?CSW identifies no further need for intervention and no barriers to discharge at this time.  ? ?Kathrin Greathouse, MSW, LCSW ?[facility]  ?Clinical Social Worker  ?[phone] ?Nov 14, 2021  1:06 PM  ?

## 2021-11-21 NOTE — Anesthesia Postprocedure Evaluation (Signed)
Anesthesia Post Note ? ?Patient: Renee Matthews ? ?Procedure(s) Performed: AN AD HOC LABOR EPIDURAL ? ?  ? ?Patient location during evaluation: Mother Baby ?Anesthesia Type: Epidural ?Level of consciousness: awake and alert ?Pain management: pain level controlled ?Vital Signs Assessment: post-procedure vital signs reviewed and stable ?Respiratory status: spontaneous breathing, nonlabored ventilation and respiratory function stable ?Cardiovascular status: stable ?Postop Assessment: no headache, no backache, epidural receding, no apparent nausea or vomiting, patient able to bend at knees, adequate PO intake and able to ambulate ?Anesthetic complications: no ? ? ?No notable events documented. ? ?Last Vitals:  ?Vitals:  ? 11/21/21 0820 11/21/21 1352  ?BP: 129/79 125/87  ?Pulse: 72 68  ?Resp: 18 20  ?Temp: 36.7 ?C 37 ?C  ?SpO2: 98% 98%  ?  ?Last Pain:  ?Vitals:  ? 11/21/21 1352  ?TempSrc: Oral  ?PainSc:   ? ?Pain Goal:   ? ?  ?  ?  ?  ?  ?  ?  ? ?Masahiro Iglesia Hristova ? ? ? ? ?

## 2021-11-21 NOTE — Lactation Note (Signed)
This note was copied from a baby's chart. ?Lactation Consultation Note ? ?Patient Name: Renee Matthews ?Today's Date: 11/21/2021 ?Reason for consult: Follow-up assessment;Term;Infant weight loss (-3% weight loss) ?Age:28 hours ?P4, term female infant with -3% weight loss. ?LC encouraged mom to breastfeed infant skin to skin with feedings until milk supply is establish and do breast stimulation techniques to keep infant alert and awake while breastfeeding. ?Mom latched infant on her right breast using the cradle hold position, infant latched with depth and actively BF, infant was still BF after 10 minutes when LC left the room. ?Mom will latch infant on both breast during a feeding and continue to breastfeed infant according to hunger cues, 8 to 12 + or more times within 24 hours. ?Mom is aware infant may start cluster feeding, this is normal pattern of behavior infant's 2nd day of life. ?Mom knows to sleep when infant is sleeping for maternal rest, stay hydrated and balance meals for breastfeeding woman. ?Mom knows to call RN/LC if she has BF questions, concerns or needs further latch assistance with latching infant at the breast.  ?Maternal Data ?  ? ?Feeding ?Mother's Current Feeding Choice: Breast Milk ? ?LATCH Score ?Latch: Grasps breast easily, tongue down, lips flanged, rhythmical sucking. ? ?Audible Swallowing: Spontaneous and intermittent ? ?Type of Nipple: Everted at rest and after stimulation ? ?Comfort (Breast/Nipple): Soft / non-tender ? ?Hold (Positioning): Assistance needed to correctly position infant at breast and maintain latch. ? ?LATCH Score: 9 ? ? ?Lactation Tools Discussed/Used ?  ? ?Interventions ?Interventions: Assisted with latch;Skin to skin;Breast compression;Adjust position;Support pillows;Position options;Education ? ?Discharge ?  ? ?Consult Status ?Consult Status: Follow-up ?Date: 11/22/21 ?Follow-up type: In-patient ? ? ? ?Danelle Earthly ?11/21/2021, 6:00 PM ? ? ? ?

## 2021-11-21 NOTE — Social Work (Addendum)
CSW received consult due to score 13 on Edinburgh Depression Screen.   ? ?Today, CSW met with MOB at bedside. CSW provided education regarding Baby Blues vs PMADs and provided MOB with resources for mental health follow up.  CSW encouraged MOB to evaluate her mental health throughout the postpartum period with the use of the New Mom Checklist developed by Postpartum Progress as well as the Edinburgh Postnatal Depression Scale and notify a medical professional if symptoms arise.    ? ?Renee Matthews, MSW, LCSW ?[facility]  ?Clinical Social Worker  ?[phone] ?11/21/2021  3:49 PM  ?

## 2021-11-21 NOTE — Discharge Instructions (Signed)
As per discharge pamphlet °

## 2021-11-22 NOTE — Lactation Note (Signed)
This note was copied from a baby's chart. ?Lactation Consultation Note ? ?Patient Name: Girl Michaelia Beilfuss ?Today's Date: 11/22/2021 ?Reason for consult: Follow-up assessment;Term;Infant weight loss;Other (Comment) (6 % weight loss. Bili check this am 8.7 at 37 hours .  per mom baby recently fed and has been feeding well. LC reviewed and updated the doc flow sheets per mom. LC Reviewed BF D/C teaching and mom aware of her LC resources after D/C.) - Latch scores have been 9's  ?Age:53 hours ? ?Maternal Data ?  ? ?Feeding ?Mother's Current Feeding Choice: Breast Milk ? ?LATCH Score ?  ? ?  ? ?  ? ?  ? ?  ? ?  ? ? ?Lactation Tools Discussed/Used ?  ? ?Interventions ?  ? ?Discharge ?Discharge Education: Engorgement and breast care;Warning signs for feeding baby ?Pump: Manual;Personal ? ?Consult Status ?Consult Status: Complete ?Date: 11/22/21 ? ? ? ?Matilde Sprang Sulamita Lafountain ?11/22/2021, 8:37 AM ? ? ? ?

## 2021-11-22 NOTE — Discharge Summary (Signed)
? ?  Postpartum Discharge Summary ? ? ?   ?Patient Name: Renee Matthews ?DOB: 06-Dec-1995 ?MRN: [id number] ? ?Date of admission: 11/20/2021 ?Delivery date:11/20/2021  ?Delivering provider: Huel Cote  ?Date of discharge: 11/22/2021 ? ?Admitting diagnosis: Indication for care in labor and delivery, antepartum [O75.9] ?NSVD (normal spontaneous vaginal delivery) [O80] ?Intrauterine pregnancy: [redacted]w[redacted]d     ?Secondary diagnosis:  Principal Problem: ?  Indication for care in labor and delivery, antepartum ?Active Problems: ?  NSVD (normal spontaneous vaginal delivery) ? ?   ?Discharge diagnosis: Term Pregnancy Delivered                                              ?Post partum procedures: NA ?Augmentation: AROM and Pitocin ?Complications: None ? ?Hospital course: Induction of Labor With Vaginal Delivery   ?26 y.o. yo G4P4004 at [redacted]w[redacted]d was admitted to the hospital 11/20/2021 for induction of labor.  Indication for induction: Elective.  Patient had an uncomplicated labor course as follows: ?Membrane Rupture Time/Date: 1:45 PM ,11/20/2021   ?Delivery Method:Vaginal, Spontaneous  ?Episiotomy: None  ?Lacerations:  None  ?Details of delivery can be found in separate delivery note.  Patient had a routine postpartum course. Patient is discharged home 11/22/21. ? ?Newborn Data: ?Birth date:11/20/2021  ?Birth time:4:47 PM  ?Gender:Female  ?Living status:Living  ?Apgars:8 ,8  ?Weight:3615 g  ? ?Magnesium Sulfate received: No ?BMZ received: No ?Rhophylac:N/A ? ?Physical exam  ?Vitals:  ? 11/21/21 0410 11/21/21 0820 11/21/21 1352 11/21/21 2013  ?BP: 128/81 129/79 125/87 125/86  ?Pulse: 71 72 68 73  ?Resp: 18 18 20 17   ?Temp: 98.2 ?F (36.8 ?C) 98 ?F (36.7 ?C) 98.6 ?F (37 ?C) 98.2 ?F (36.8 ?C)  ?TempSrc: Oral Oral Oral Oral  ?SpO2: 97% 98% 98% 99%  ?Weight:      ?Height:      ? ?General: alert, cooperative, and no distress ?Lochia: appropriate ?Uterine Fundus: firm ?DVT Evaluation: No evidence of DVT seen on physical exam. ?Labs: ?Lab  Results  ?Component Value Date  ? WBC 13.9 (H) 11/21/2021  ? HGB 10.6 (L) 11/21/2021  ? HCT 33.0 (L) 11/21/2021  ? MCV 82.7 11/21/2021  ? PLT 306 11/21/2021  ? ? ?  Latest Ref Rng & Units 04/06/2021  ?  4:51 PM  ?CMP  ?Glucose 70 - 99 mg/dL 98    ?BUN 6 - 20 mg/dL 9    ?Creatinine 0.44 - 1.00 mg/dL 04/08/2021    ?Sodium 135 - 145 mmol/L 134    ?Potassium 3.5 - 5.1 mmol/L 3.9    ?Chloride 98 - 111 mmol/L 104    ?CO2 22 - 32 mmol/L 22    ?Calcium 8.9 - 10.3 mg/dL 9.1    ?Total Protein 6.5 - 8.1 g/dL 7.9    ?Total Bilirubin 0.3 - 1.2 mg/dL 0.6    ?Alkaline Phos 38 - 126 U/L 52    ?AST 15 - 41 U/L 26    ?ALT 0 - 44 U/L 25    ? ?Edinburgh Score: ? ?  11/21/2021  ?  3:37 PM  ?Edinburgh Postnatal Depression Scale Screening Tool  ?I have been able to laugh and see the funny side of things. 1  ?I have looked forward with enjoyment to things. 2  ?I have blamed myself unnecessarily when things went wrong. 1  ?I have been anxious or worried for no good  reason. 2  ?I have felt scared or panicky for no good reason. 1  ?Things have been getting on top of me. 2  ?I have been so unhappy that I have had difficulty sleeping. 1  ?I have felt sad or miserable. 2  ?I have been so unhappy that I have been crying. 1  ?The thought of harming myself has occurred to me. 0  ?Edinburgh Postnatal Depression Scale Total 13  ? ? ? ? ?After visit meds:  ?Allergies as of 11/22/2021   ?No Known Allergies ?  ? ?  ?Medication List  ?  ? ?TAKE these medications   ? ?diphenhydrAMINE 25 MG tablet ?Commonly known as: SOMINEX ?Take 25 mg by mouth at bedtime as needed for itching, allergies or sleep. ?  ?ibuprofen 600 MG tablet ?Commonly known as: ADVIL ?Take 1 tablet (600 mg total) by mouth every 6 (six) hours. ?What changed:  ?when to take this ?reasons to take this ?  ?ondansetron 4 MG tablet ?Commonly known as: ZOFRAN ?Take 1 tablet (4 mg total) by mouth every 6 (six) hours. ?  ?promethazine 25 MG tablet ?Commonly known as: PHENERGAN ?Take 1 tablet (25 mg total)  by mouth every 6 (six) hours as needed for up to 7 days for nausea or vomiting. ?  ?sertraline 50 MG tablet ?Commonly known as: ZOLOFT ?Take 50 mg by mouth daily. ?  ? ?  ? ?  ?  ? ? ?  ?Discharge Care Instructions  ?(From admission, onward)  ?  ? ? ?  ? ?  Start     Ordered  ? 11/22/21 0000  Discharge wound care:       ?Comments: For a cesarean delivery: ?You may wash incision with soap and water.  ?Do not soak or submerge the incision for 2 weeks. ?Keep incision dry. You may need to keep a sanitary pad or panty liner between the incision and your clothing for comfort and to keep the incision dry. ?If you note drainage, increased pain, or increased redness of the incision, then please notify your physician.  ? 11/22/21 1047  ? 11/22/21 0000  If the dressing is still on your incision site when you go home, remove it on the third day after your surgery date. Remove dressing if it begins to fall off, or if it is dirty or damaged before the third day.       ?Comments: For a cesarean delivery  ? 11/22/21 1047  ? ?  ?  ? ?  ? ? ? ?Discharge home in stable condition ?Infant Feeding: Breast ?Infant Disposition:home with mother ?Discharge instruction: per After Visit Summary and Postpartum booklet. ?Activity: Advance as tolerated. Pelvic rest for 6 weeks.  ?Diet: routine diet ?Anticipated Birth Control: Unsure ?Postpartum Appointment:4 weeks ?Future Appointments:No future appointments. ?Follow up Visit: ? Follow-up Information   ? ? Huel Cote, MD. Schedule an appointment as soon as possible for a visit in 6 week(s).   ?Specialty: Obstetrics and Gynecology ?Contact information: ?[address] ?[address] ?[place] [address] ?[phone] ? ? ?  ?  ? ?  ?  ? ?  ? ? ? ?  ? ?11/22/2021 ?Waynard Reeds, MD ? ? ?

## 2021-12-02 ENCOUNTER — Telehealth (HOSPITAL_COMMUNITY): Payer: Self-pay | Admitting: *Deleted

## 2021-12-02 NOTE — Telephone Encounter (Signed)
Attempted hospital discharge follow-up call. Left message for patient to return RN call. Hospital EPDS on 11/21/21 = 13. Fax sent to delivering provider, Dr. Paula Compton, informing her of EPDS score. Erline Levine, RN, 12/02/21, [phone] ?

## 2022-01-29 ENCOUNTER — Emergency Department (HOSPITAL_COMMUNITY): Payer: Medicaid Other

## 2022-01-29 ENCOUNTER — Other Ambulatory Visit: Payer: Self-pay

## 2022-01-29 ENCOUNTER — Encounter (HOSPITAL_COMMUNITY): Payer: Self-pay

## 2022-01-29 ENCOUNTER — Emergency Department (HOSPITAL_COMMUNITY)
Admission: EM | Admit: 2022-01-29 | Discharge: 2022-01-30 | Disposition: A | Discharge status: Home / Self Care | Payer: Medicaid Other | Attending: Emergency Medicine | Admitting: Emergency Medicine

## 2022-01-29 DIAGNOSIS — J45909 Unspecified asthma, uncomplicated: Secondary | ICD-10-CM | POA: Diagnosis not present

## 2022-01-29 DIAGNOSIS — R109 Unspecified abdominal pain: Secondary | ICD-10-CM | POA: Insufficient documentation

## 2022-01-29 DIAGNOSIS — R110 Nausea: Secondary | ICD-10-CM | POA: Diagnosis not present

## 2022-01-29 DIAGNOSIS — K5900 Constipation, unspecified: Secondary | ICD-10-CM | POA: Diagnosis not present

## 2022-01-29 DIAGNOSIS — R748 Abnormal levels of other serum enzymes: Secondary | ICD-10-CM

## 2022-01-29 DIAGNOSIS — R160 Hepatomegaly, not elsewhere classified: Secondary | ICD-10-CM

## 2022-01-29 LAB — COMPREHENSIVE METABOLIC PANEL
ALT: 1244 U/L — ABNORMAL HIGH (ref 0–44)
AST: 933 U/L — ABNORMAL HIGH (ref 15–41)
Albumin: 4.3 g/dL (ref 3.5–5.0)
Alkaline Phosphatase: 302 U/L — ABNORMAL HIGH (ref 38–126)
Anion gap: 10 (ref 5–15)
BUN: 13 mg/dL (ref 6–20)
CO2: 21 mmol/L — ABNORMAL LOW (ref 22–32)
Calcium: 9.2 mg/dL (ref 8.9–10.3)
Chloride: 110 mmol/L (ref 98–111)
Creatinine, Ser: 0.9 mg/dL (ref 0.44–1.00)
GFR, Estimated: >60 mL/min (ref >60)
Glucose, Bld: 132 mg/dL — ABNORMAL HIGH (ref 70–99)
Potassium: 3.8 mmol/L (ref 3.5–5.1)
Sodium: 141 mmol/L (ref 135–145)
Total Bilirubin: 2.2 mg/dL — ABNORMAL HIGH (ref 0.3–1.2)
Total Protein: 7.4 g/dL (ref 6.5–8.1)

## 2022-01-29 LAB — CBC
HCT: 40.9 % (ref 36.0–46.0)
Hemoglobin: 13.1 g/dL (ref 12.0–15.0)
MCH: 26.3 pg (ref 26.0–34.0)
MCHC: 32 g/dL (ref 30.0–36.0)
MCV: 82.1 fL (ref 80.0–100.0)
Platelets: 368 10*3/uL (ref 150–400)
RBC: 4.98 MIL/uL (ref 3.87–5.11)
RDW: 15.7 % — ABNORMAL HIGH (ref 11.5–15.5)
WBC: 6.1 10*3/uL (ref 4.0–10.5)
nRBC: 0 % (ref 0.0–0.2)

## 2022-01-29 LAB — URINALYSIS, ROUTINE W REFLEX MICROSCOPIC
Bilirubin Urine: NEGATIVE
Glucose, UA: NEGATIVE mg/dL
Hgb urine dipstick: NEGATIVE
Ketones, ur: NEGATIVE mg/dL
Leukocytes,Ua: NEGATIVE
Nitrite: NEGATIVE
Protein, ur: NEGATIVE mg/dL
Specific Gravity, Urine: 1.008 (ref 1.005–1.030)
pH: 6 (ref 5.0–8.0)

## 2022-01-29 LAB — POC URINE PREG, ED: Preg Test, Ur: NEGATIVE

## 2022-01-29 LAB — LIPASE, BLOOD: Lipase: 48 U/L (ref 11–51)

## 2022-01-29 MED ORDER — IOHEXOL 300 MG/ML  SOLN
100.0000 mL | Freq: Once | INTRAMUSCULAR | Status: AC | PRN
  Administered 2022-01-30: 100 mL via INTRAVENOUS

## 2022-01-29 MED ORDER — SODIUM CHLORIDE 0.9 % IV BOLUS
1000.0000 mL | Freq: Once | INTRAVENOUS | Status: AC
  Administered 2022-01-29: 1000 mL via INTRAVENOUS

## 2022-01-29 MED ORDER — ONDANSETRON HCL 4 MG/2ML IJ SOLN
4.0000 mg | Freq: Once | INTRAMUSCULAR | Status: AC
  Administered 2022-01-29: 4 mg via INTRAVENOUS
  Filled 2022-01-29: qty 2

## 2022-01-29 NOTE — ED Provider Notes (Signed)
11:43 PM Assumed care from PA Roemhildt, please see their note for full history, physical and decision making until this point. In brief this is a 26 y.o. year old female who presented to the ED tonight with Abdominal Pain (Epigastric )     Concern for cholecystitis vs biliary colic. Pending CT. Added on hepatitis panel and tylenol level.   Labs ok. Ct scan with e/o hepatic mass, needs MRI. Will speak with PCP to get this scheduled. Tolerating PO. Will d/c on Reglan as well.   Discharge instructions, including strict return precautions for new or worsening symptoms, given. Patient and/or family verbalized understanding and agreement with the plan as described.   Labs, studies and imaging reviewed by myself and considered in medical decision making if ordered. Imaging interpreted by radiology.  Labs Reviewed  COMPREHENSIVE METABOLIC PANEL - Abnormal; Notable for the following components:      Result Value   CO2 21 (*)    Glucose, Bld 132 (*)    AST 933 (*)    ALT 1,244 (*)    Alkaline Phosphatase 302 (*)    Total Bilirubin 2.2 (*)    All other components within normal limits  CBC - Abnormal; Notable for the following components:   RDW 15.7 (*)    All other components within normal limits  URINALYSIS, ROUTINE W REFLEX MICROSCOPIC - Abnormal; Notable for the following components:   Color, Urine AMBER (*)    All other components within normal limits  LIPASE, BLOOD  ACETAMINOPHEN LEVEL  HEPATITIS PANEL, ACUTE  POC URINE PREG, ED    CT ABDOMEN PELVIS W CONTRAST    (Results Pending)    No follow-ups on file.    Roshni Burbano, Barbara Cower, MD 01/30/22 [phone]

## 2022-01-29 NOTE — ED Triage Notes (Signed)
Pt presents to ED with epigastric pain that started about 3 days ago after eating, pt has not had BM in two days, no vomiting, but constant nausea.

## 2022-01-29 NOTE — ED Provider Notes (Signed)
[facility] EMERGENCY DEPARTMENT Provider Note   CSN: [id number] Arrival date & time: 01/29/22  1959     History  Chief Complaint  Patient presents with   Abdominal Pain    Epigastric     Renee Matthews is a 26 y.o. female who presents emergency department complaining of abdominal pain starting 3 days ago.  States that her pain started after eating.  Reports no bowel movement past 2 days, and has been constantly nauseous without vomiting.  States she had similar symptoms with problems with her gallbladder previously.  Describes the pain as sometimes dull, sometimes sharp, and often radiates to her back.  No fever.  She is also noted that her urine has been darker, but no discomfort with urinating.   Abdominal Pain Associated symptoms: constipation   Associated symptoms: no chills, no fever, no nausea and no vomiting        Home Medications Prior to Admission medications   Medication Sig Start Date End Date Taking? Authorizing Provider  diphenhydrAMINE (SOMINEX) 25 MG tablet Take 25 mg by mouth at bedtime as needed for itching, allergies or sleep.    [provider]  ibuprofen (ADVIL) 600 MG tablet Take 1 tablet (600 mg total) by mouth every 6 (six) hours. 11/21/21   Meisinger, Todd, MD  ondansetron (ZOFRAN) 4 MG tablet Take 1 tablet (4 mg total) by mouth every 6 (six) hours. 07/21/20   Carroll Sage, PA-C  promethazine (PHENERGAN) 25 MG tablet Take 1 tablet (25 mg total) by mouth every 6 (six) hours as needed for up to 7 days for nausea or vomiting. 04/06/21 04/13/21  Redwine, Madison A, PA-C  sertraline (ZOLOFT) 50 MG tablet Take 50 mg by mouth daily.    [provider]      Allergies    Patient has no known allergies.    Review of Systems   Review of Systems  Constitutional:  Negative for chills and fever.  Gastrointestinal:  Positive for abdominal pain and constipation. Negative for nausea and vomiting.  All other systems reviewed and are  negative.   Physical Exam Updated Vital Signs BP 121/78   Pulse 60   Temp 97.9 F (36.6 C) (Oral)   Resp 17   Ht 5\' 3"  (1.6 m)   Wt 81.2 kg   SpO2 97%   BMI 31.71 kg/m  Physical Exam Vitals and nursing note reviewed.  Constitutional:      Appearance: Normal appearance.  HENT:     Head: Normocephalic and atraumatic.  Eyes:     Conjunctiva/sclera: Conjunctivae normal.  Cardiovascular:     Rate and Rhythm: Normal rate and regular rhythm.  Pulmonary:     Effort: Pulmonary effort is normal. No respiratory distress.     Breath sounds: Normal breath sounds.  Abdominal:     General: There is no distension.     Palpations: Abdomen is soft.     Tenderness: There is abdominal tenderness in the epigastric area. There is no right CVA tenderness, left CVA tenderness, guarding or rebound.  Skin:    General: Skin is warm and dry.  Neurological:     General: No focal deficit present.     Mental Status: She is alert.     ED Results / Procedures / Treatments   Labs (all labs ordered are listed, but only abnormal results are displayed) Labs Reviewed  COMPREHENSIVE METABOLIC PANEL - Abnormal; Notable for the following components:      Result Value  CO2 21 (*)    Glucose, Bld 132 (*)    AST 933 (*)    ALT 1,244 (*)    Alkaline Phosphatase 302 (*)    Total Bilirubin 2.2 (*)    All other components within normal limits  CBC - Abnormal; Notable for the following components:   RDW 15.7 (*)    All other components within normal limits  URINALYSIS, ROUTINE W REFLEX MICROSCOPIC - Abnormal; Notable for the following components:   Color, Urine AMBER (*)    All other components within normal limits  LIPASE, BLOOD  ACETAMINOPHEN LEVEL  HEPATITIS PANEL, ACUTE  POC URINE PREG, ED    EKG None  Radiology No results found.  Procedures Procedures    Medications Ordered in ED Medications  ondansetron (ZOFRAN) injection 4 mg (4 mg Intravenous Given 01/29/22 2226)  sodium chloride  0.9 % bolus 1,000 mL (0 mLs Intravenous Stopped 01/29/22 2335)    ED Course/ Medical Decision Making/ A&P                           Medical Decision Making Amount and/or Complexity of Data Reviewed Labs: ordered.  Risk Prescription drug management.   This patient is a 26 y.o. female  who presents to the ED for concern of abdominal pain.   Differential diagnoses prior to evaluation: The emergent differential diagnosis includes, but is not limited to,  Dyspepsia, PUD, GERD, Gastritis, pancreatitis, indigestion, drug related, gastroparesis, gastric cancer, biliary disease, ACS, pericarditis, pneumonia, abdominal hernia, intestinal ischemia, esophageal rupture, volvulus, hepatitis, pregnancy. This is not an exhaustive differential.   Past Medical History / Co-morbidities: Anxiety, asthma, gallstones  Physical Exam: Physical exam performed. The pertinent findings include: Normal vital signs.  Abdomen soft, with some epigastric tenderness to palpation.  No CVA tenderness.  Lab Tests/Imaging studies: I Ordered, and personally interpreted labs/imaging and the pertinent results include: No leukocytosis.  Elevated liver enzymes with AST 933, ALT 1244, alkaline phosphatase 302, total bilirubin 2.2.  Medications: I ordered medication including IV fluids and Zofran for nausea.  I have reviewed the patients home medicines and have made adjustments as needed.   Disposition: Patient discussed and care transferred to attending physician Dr. Clayborne Dana at shift change. Please see his/her note for further details regarding further ED course and disposition. Plan at time of handoff is follow up CT imaging and continue nausea management.   Final Clinical Impression(s) / ED Diagnoses Final diagnoses:  None    Rx / DC Orders ED Discharge Orders     None      Portions of this report may have been transcribed using voice recognition software. Every effort was made to ensure accuracy; however,  inadvertent computerized transcription errors may be present.    Su Monks, PA-C 01/29/22 2346    Pricilla Loveless, MD 01/30/22 0009

## 2022-01-30 LAB — HEPATITIS PANEL, ACUTE
HCV Ab: NONREACTIVE
Hep A IgM: NONREACTIVE
Hep B C IgM: NONREACTIVE
Hepatitis B Surface Ag: NONREACTIVE

## 2022-01-30 LAB — ACETAMINOPHEN LEVEL: Acetaminophen (Tylenol), Serum: <10 ug/mL — ABNORMAL LOW (ref 10–30)

## 2022-01-30 MED ORDER — METOCLOPRAMIDE HCL 5 MG/ML IJ SOLN
10.0000 mg | Freq: Once | INTRAMUSCULAR | Status: AC
  Administered 2022-01-30: 10 mg via INTRAVENOUS
  Filled 2022-01-30: qty 2

## 2022-01-30 MED ORDER — METOCLOPRAMIDE HCL 10 MG PO TABS: 10.0000 mg | ORAL_TABLET | Freq: Four times a day (QID) | ORAL | 0 refills | Status: AC | PRN

## 2024-03-21 NOTE — Therapy (Signed)
 OUTPATIENT PHYSICAL THERAPY VESTIBULAR EVALUATION     Patient Name: Renee Matthews MRN: [id number] DOB:[birth year], 28 y.o., female Today's Date: 03/22/2024  END OF SESSION:  PT End of Session - 03/22/24 2156     Visit Number 1    Number of Visits 4    Date for PT Re-Evaluation 04/19/24    Authorization Type MCD    PT Start Time 1555    PT Stop Time 1635    PT Time Calculation (min) 40 min    Activity Tolerance Patient tolerated treatment well    Behavior During Therapy [facility] for tasks assessed/performed          Past Medical History:  Diagnosis Date   Anxiety    Asthma    Gallstones    Postpartum hemorrhage    ROM (rupture of membranes), premature 03/04/2017   SVD (spontaneous vaginal delivery) 12/22/2018   Past Surgical History:  Procedure Laterality Date   NO PAST SURGERIES     WISDOM TOOTH EXTRACTION     Patient Active Problem List   Diagnosis Date Noted   Indication for care in labor and delivery, antepartum 11/20/2021   NSVD (normal spontaneous vaginal delivery) 11/20/2021   SVD (spontaneous vaginal delivery) 12/22/2018   Pregnant 12/21/2018   Indication for care in labor or delivery 03/04/2017   ROM (rupture of membranes), premature 03/04/2017   Postpartum care following vaginal delivery 03/04/2017    PCP: Medicine, [facility] (Inactive)  REFERRING PROVIDER: Chinita Hoy CROME, PA*   REFERRING DIAG: H81.13 BPPV, Nausea, balance  THERAPY DIAG:  Dizziness and giddiness  ONSET DATE: 3-4 week onset  Rationale for Evaluation and Treatment: Rehabilitation  SUBJECTIVE:   SUBJECTIVE STATEMENT: She reports dizziness and room spinning. Feels off balance and has nausea. This has been going on for 3-4 weeks and not sure why. Feel it the most with getting up and with turning head to right. Feels like the room spinning.   PERTINENT HISTORY: BPPV  PAIN:  Are you having pain? No  PRECAUTIONS: None  RED FLAGS: None   WEIGHT  BEARING RESTRICTIONS: No  FALLS: Has patient fallen in last 6 months? No   PLOF: Independent  PATIENT GOALS: reduce dizziness  OBJECTIVE:  Note: Objective measures were completed at Evaluation unless otherwise noted.    Cervical ROM:  WNL ROM but dizziness with right rotation and with extnesion   VESTIBULAR ASSESSMENT:  OCULOMOTOR EXAM:  Smooth Pursuits: intact  Saccades: intact    POSITIONAL TESTING:  Positional tests: Right Dix-Hallpike: Positive Left Dix-Hallpike: no nystagmus, only dizziness with sitting up Horizontal/BBQ roll for horizontal canal: negative   OTHOSTATICS: Negative for orthostatic hypotension                                                                                                                           TREATMENT DATE: 03/22/24   Canalith Repositioning:  Epley Right: Number of Reps: 2, Response to Treatment: symptoms improved, and Comment:  Good tolerance with only mild nystagmus and Semont Right Posterior: Number of Reps: 1 Therex: HEP review with written instructions, verbal instructions, illustration, and demo.   PATIENT EDUCATION: Education details: diagnosis, PT plan of care, treatment intervention and HEP Person educated: Patient Education method: Explanation, Demonstration, Verbal cues, and Handouts Education comprehension: verbalized understanding  HOME EXERCISE PROGRAM:  GOALS: Goals reviewed with patient? Yes  SHORT TERM GOALS: Target date: 03/22/24   She will show good understanding of HEP and PT plan of care Baseline: Goal status: MET TODAY  LONG TERM Goals only need to be written in the event she returns to PT.    ASSESSMENT:  CLINICAL IMPRESSION: Patient is a 28 y.o. femaile who was seen today for physical therapy evaluation and treatment for BPPV. She was found to have + dix hallpike test for Rt ear and this was negative for left ear. Her symptoms and nystagmus was mild today so she will likely be able to  resolve this condition on her own with HEP after treatment provided today for canalith repositioning.  She will likely not need to return to PT however she may if her symptoms do not resolve in the next week.  OBJECTIVE IMPAIRMENTS: dizziness.   ACTIVITY LIMITATIONS: bed mobility, looking up or turning head  PARTICIPATION LIMITATIONS: sleeping, driving  PERSONAL FACTORS: none    REHAB POTENTIAL: Excellent  CLINICAL DECISION MAKING: Stable/uncomplicated  EVALUATION COMPLEXITY: Low   PLAN:  PT FREQUENCY: 1x/week  PT DURATION: 4 weeks  PLANNED INTERVENTIONS: 97110-Therapeutic exercises, 97530- Therapeutic activity, [id number]- Neuromuscular re-education, 97535- Self Care, [id number]- Manual therapy, and 95992- Canalith repositioning  PLAN FOR NEXT SESSION: Likely one time visit but she was instructed to return if symptoms do not resolve so wrote PT plan out for 4 weeks.   Redell JONELLE Moose, PT,DPT 03/22/2024, 9:58 PM

## 2024-03-22 ENCOUNTER — Ambulatory Visit: Attending: Physician Assistant | Admitting: Physical Therapy

## 2024-03-22 ENCOUNTER — Encounter: Payer: Self-pay | Admitting: Physical Therapy

## 2024-03-22 DIAGNOSIS — R42 Dizziness and giddiness: Secondary | ICD-10-CM | POA: Insufficient documentation

## 2024-09-11 ENCOUNTER — Ambulatory Visit: Attending: Physician Assistant

## 2024-09-11 DIAGNOSIS — R42 Dizziness and giddiness: Secondary | ICD-10-CM | POA: Insufficient documentation

## 2024-09-11 NOTE — Therapy (Signed)
 " OUTPATIENT PHYSICAL THERAPY VESTIBULAR EVALUATION     Patient Name: Renee Matthews MRN: [id number] DOB:[birth year], 29 y.o., female Today's Date: 09/11/2024  END OF SESSION:  PT End of Session - 09/11/24 1059     Visit Number 1    Number of Visits 4    Date for Recertification  10/09/24    Authorization Type [employer]    PT Start Time 1100    PT Stop Time 1138    PT Time Calculation (min) 38 min    Activity Tolerance Patient tolerated treatment well    Behavior During Therapy WFL for tasks assessed/performed          Past Medical History:  Diagnosis Date   Anxiety    Asthma    Gallstones    Postpartum hemorrhage    ROM (rupture of membranes), premature 03/04/2017   SVD (spontaneous vaginal delivery) 12/22/2018   Past Surgical History:  Procedure Laterality Date   NO PAST SURGERIES     WISDOM TOOTH EXTRACTION     Patient Active Problem List   Diagnosis Date Noted   Indication for care in labor and delivery, antepartum 11/20/2021   NSVD (normal spontaneous vaginal delivery) 11/20/2021   SVD (spontaneous vaginal delivery) 12/22/2018   Pregnant 12/21/2018   Indication for care in labor or delivery 03/04/2017   ROM (rupture of membranes), premature 03/04/2017   Postpartum care following vaginal delivery 03/04/2017     PCP: Medicine, [facility] (Inactive)  REFERRING PROVIDER: Chinita Hoy CROME, PA*   REFERRING DIAG: H81.13 BPPV, Nausea, balance  THERAPY DIAG:  Dizziness and giddiness  ONSET DATE: 2 week onset  Rationale for Evaluation and Treatment: Rehabilitation  SUBJECTIVE:   SUBJECTIVE STATEMENT: Pt reports reoccuance of dizziness upon sitting up. Pt was treated approx 6 months ago for same similar episode. Pt reports dizziness with head turns when driving and sitting up and bending down.    PERTINENT HISTORY: BPPV  PAIN:  Are you having pain? No  PRECAUTIONS: None  RED FLAGS: None   WEIGHT BEARING RESTRICTIONS:  No  FALLS: Has patient fallen in last 6 months? No   PLOF: Independent  PATIENT GOALS: reduce/abolish dizziness  OBJECTIVE:  Note: Objective measures were completed at Evaluation unless otherwise noted.    Cervical ROM:  WNL ROM but dizziness with right rotation   VESTIBULAR ASSESSMENT:  OCULOMOTOR EXAM:  Smooth Pursuits: intact  Saccades: intact    VESTIBULAR - OCULAR REFLEX:  Head-Impulse Test: HIT Right: negative    POSITIONAL TESTING:  Positional tests: Right Dix-Hallpike: Positive Left Dix-Hallpike: no nystagmus, only dizziness with sitting up Horizontal/BBQ roll for horizontal canal: negative   OTHOSTATICS: Negative for orthostatic hypotension                                                                                                                           TREATMENT DATE: 09/11/24   Canalith Repositioning:  Epley Right: Number of Reps: 2, Response to  Treatment: symptoms improved, and Comment: Good tolerance with only mild nystagmus and Semont Right Posterior: Number of Reps: 1   PATIENT EDUCATION: Education details: diagnosis, PT plan of care, treatment intervention and HEP Person educated: Patient Education method: Explanation, Demonstration, Verbal cues, and Handouts Education comprehension: verbalized understanding  HOME EXERCISE PROGRAM: Continue with current HEP  GOALS: Goals reviewed with patient? Yes  SHORT TERM GOALS: Target date:    She will self report decrease in dizziness by 25%  Baseline: Goal status: MET TODAY  LONG TERM Goals only need to be written in the event she returns to PT.    ASSESSMENT:  CLINICAL IMPRESSION: Patient is a 29 y.o. femaile who was seen today for physical therapy evaluation and treatment for BPPV. She was found to have + dix hallpike test for Rt ear and this was negative for left ear. Her symptoms and nystagmus were very mild today so she will likely be able to resolve this condition on her own with HEP  after treatment provided today for canalith repositioning. In the likelihood that she continues to have dizziness a return visit was advised in 1 week.    OBJECTIVE IMPAIRMENTS: dizziness.   ACTIVITY LIMITATIONS: bed mobility, looking up or turning head  PARTICIPATION LIMITATIONS: sleeping, driving  PERSONAL FACTORS: none    REHAB POTENTIAL: Excellent  CLINICAL DECISION MAKING: Stable/uncomplicated  EVALUATION COMPLEXITY: Low   PLAN:  PT FREQUENCY: 1x/week  PT DURATION: 4 weeks  PLANNED INTERVENTIONS: 97110-Therapeutic exercises, 97530- Therapeutic activity, [id number]- Neuromuscular re-education, 97535- Self Care, 02859- Manual therapy, and 95992- Canalith repositioning  PLAN FOR NEXT SESSION: Admin DHI and further positional testing and repeat maneuvers Epley or Semont  Massie Ada PT, DPT 09/11/24 11:46 AM  "

## 2024-09-26 ENCOUNTER — Ambulatory Visit
